# Patient Record
Sex: Female | Born: 1935 | Race: White | Hispanic: No | Smoking: Former smoker
Health system: Southern US, Community
[De-identification: ages and names within clinical notes are randomized; demographics above are authoritative.]

## PROBLEM LIST (undated history)

## (undated) DIAGNOSIS — I4891 Unspecified atrial fibrillation: Secondary | ICD-10-CM

## (undated) DIAGNOSIS — J189 Pneumonia, unspecified organism: Secondary | ICD-10-CM

## (undated) DIAGNOSIS — F0390 Unspecified dementia without behavioral disturbance: Secondary | ICD-10-CM

## (undated) DIAGNOSIS — E876 Hypokalemia: Secondary | ICD-10-CM

## (undated) DIAGNOSIS — S2239XA Fracture of one rib, unspecified side, initial encounter for closed fracture: Secondary | ICD-10-CM

## (undated) DIAGNOSIS — I2510 Atherosclerotic heart disease of native coronary artery without angina pectoris: Secondary | ICD-10-CM

## (undated) DIAGNOSIS — J449 Chronic obstructive pulmonary disease, unspecified: Secondary | ICD-10-CM

## (undated) DIAGNOSIS — I219 Acute myocardial infarction, unspecified: Secondary | ICD-10-CM

## (undated) HISTORY — PX: ABDOMINAL HYSTERECTOMY: SHX81

## (undated) HISTORY — PX: APPENDECTOMY: SHX54

---

## 1999-08-09 ENCOUNTER — Encounter: Payer: Self-pay | Admitting: Orthopedic Surgery

## 1999-08-14 ENCOUNTER — Inpatient Hospital Stay (HOSPITAL_COMMUNITY): Admission: RE | Admit: 1999-08-14 | Discharge: 1999-08-18 | Payer: Self-pay | Admitting: Orthopedic Surgery

## 1999-08-14 ENCOUNTER — Encounter: Payer: Self-pay | Admitting: Orthopedic Surgery

## 2000-11-08 ENCOUNTER — Ambulatory Visit (HOSPITAL_COMMUNITY): Admission: RE | Admit: 2000-11-08 | Discharge: 2000-11-08 | Payer: Self-pay | Admitting: Internal Medicine

## 2000-11-08 ENCOUNTER — Encounter: Payer: Self-pay | Admitting: Internal Medicine

## 2001-03-31 ENCOUNTER — Ambulatory Visit (HOSPITAL_COMMUNITY): Admission: RE | Admit: 2001-03-31 | Discharge: 2001-03-31 | Payer: Self-pay | Admitting: Family Medicine

## 2001-03-31 ENCOUNTER — Encounter: Payer: Self-pay | Admitting: Family Medicine

## 2001-09-23 ENCOUNTER — Encounter: Payer: Self-pay | Admitting: Emergency Medicine

## 2001-09-23 ENCOUNTER — Inpatient Hospital Stay (HOSPITAL_COMMUNITY): Admission: EM | Admit: 2001-09-23 | Discharge: 2001-09-27 | Payer: Self-pay | Admitting: Emergency Medicine

## 2001-09-24 ENCOUNTER — Encounter: Payer: Self-pay | Admitting: Family Medicine

## 2001-09-26 ENCOUNTER — Encounter: Payer: Self-pay | Admitting: Family Medicine

## 2003-01-25 ENCOUNTER — Encounter: Payer: Self-pay | Admitting: Emergency Medicine

## 2003-01-25 ENCOUNTER — Inpatient Hospital Stay (HOSPITAL_COMMUNITY): Admission: EM | Admit: 2003-01-25 | Discharge: 2003-02-03 | Payer: Self-pay | Admitting: Emergency Medicine

## 2003-01-31 ENCOUNTER — Encounter: Payer: Self-pay | Admitting: Family Medicine

## 2003-07-12 ENCOUNTER — Inpatient Hospital Stay (HOSPITAL_COMMUNITY): Admission: EM | Admit: 2003-07-12 | Discharge: 2003-07-22 | Payer: Self-pay | Admitting: Emergency Medicine

## 2003-07-16 ENCOUNTER — Encounter: Payer: Self-pay | Admitting: Orthopedic Surgery

## 2003-07-19 ENCOUNTER — Encounter: Payer: Self-pay | Admitting: Orthopedic Surgery

## 2003-07-23 ENCOUNTER — Inpatient Hospital Stay (HOSPITAL_COMMUNITY): Admission: EM | Admit: 2003-07-23 | Discharge: 2003-07-26 | Payer: Self-pay | Admitting: Emergency Medicine

## 2003-12-13 ENCOUNTER — Encounter: Payer: Self-pay | Admitting: Orthopedic Surgery

## 2004-02-15 ENCOUNTER — Ambulatory Visit (HOSPITAL_COMMUNITY): Admission: RE | Admit: 2004-02-15 | Discharge: 2004-02-15 | Payer: Self-pay | Admitting: Ophthalmology

## 2004-02-16 ENCOUNTER — Ambulatory Visit (HOSPITAL_COMMUNITY): Admission: RE | Admit: 2004-02-16 | Discharge: 2004-02-16 | Payer: Self-pay | Admitting: Family Medicine

## 2004-02-16 ENCOUNTER — Ambulatory Visit: Payer: Self-pay | Admitting: Orthopedic Surgery

## 2004-02-18 ENCOUNTER — Ambulatory Visit (HOSPITAL_COMMUNITY): Admission: RE | Admit: 2004-02-18 | Discharge: 2004-02-18 | Payer: Self-pay | Admitting: Orthopedic Surgery

## 2004-02-18 ENCOUNTER — Encounter: Payer: Self-pay | Admitting: Orthopedic Surgery

## 2004-02-18 ENCOUNTER — Ambulatory Visit: Payer: Self-pay | Admitting: Orthopedic Surgery

## 2004-02-28 ENCOUNTER — Ambulatory Visit: Payer: Self-pay | Admitting: Orthopedic Surgery

## 2004-03-07 ENCOUNTER — Encounter: Payer: Self-pay | Admitting: Orthopedic Surgery

## 2004-03-14 ENCOUNTER — Ambulatory Visit (HOSPITAL_COMMUNITY): Admission: RE | Admit: 2004-03-14 | Discharge: 2004-03-14 | Payer: Self-pay | Admitting: Ophthalmology

## 2004-03-25 ENCOUNTER — Emergency Department (HOSPITAL_COMMUNITY): Admission: EM | Admit: 2004-03-25 | Discharge: 2004-03-25 | Payer: Self-pay | Admitting: Emergency Medicine

## 2004-03-31 ENCOUNTER — Ambulatory Visit (HOSPITAL_COMMUNITY): Admission: RE | Admit: 2004-03-31 | Discharge: 2004-04-01 | Payer: Self-pay | Admitting: Orthopedic Surgery

## 2004-08-11 ENCOUNTER — Ambulatory Visit: Payer: Self-pay | Admitting: Cardiology

## 2004-08-11 ENCOUNTER — Ambulatory Visit (HOSPITAL_COMMUNITY): Admission: RE | Admit: 2004-08-11 | Discharge: 2004-08-11 | Payer: Self-pay | Admitting: Family Medicine

## 2005-07-17 ENCOUNTER — Inpatient Hospital Stay (HOSPITAL_COMMUNITY): Admission: AD | Admit: 2005-07-17 | Discharge: 2005-07-28 | Payer: Self-pay | Admitting: Family Medicine

## 2005-07-17 ENCOUNTER — Ambulatory Visit (HOSPITAL_COMMUNITY): Admission: RE | Admit: 2005-07-17 | Discharge: 2005-07-17 | Payer: Self-pay | Admitting: Family Medicine

## 2005-07-23 ENCOUNTER — Ambulatory Visit: Payer: Self-pay | Admitting: Internal Medicine

## 2005-08-16 ENCOUNTER — Inpatient Hospital Stay (HOSPITAL_COMMUNITY): Admission: EM | Admit: 2005-08-16 | Discharge: 2005-08-21 | Payer: Self-pay | Admitting: Emergency Medicine

## 2005-10-04 ENCOUNTER — Inpatient Hospital Stay (HOSPITAL_COMMUNITY): Admission: AD | Admit: 2005-10-04 | Discharge: 2005-10-06 | Payer: Self-pay | Admitting: *Deleted

## 2005-11-19 ENCOUNTER — Ambulatory Visit: Payer: Self-pay | Admitting: Pulmonary Disease

## 2007-05-06 ENCOUNTER — Ambulatory Visit (HOSPITAL_COMMUNITY): Admission: RE | Admit: 2007-05-06 | Discharge: 2007-05-06 | Payer: Self-pay | Admitting: Family Medicine

## 2007-08-11 ENCOUNTER — Ambulatory Visit: Payer: Self-pay | Admitting: Cardiovascular Disease

## 2007-08-11 ENCOUNTER — Inpatient Hospital Stay (HOSPITAL_COMMUNITY): Admission: EM | Admit: 2007-08-11 | Discharge: 2007-08-14 | Payer: Self-pay | Admitting: Emergency Medicine

## 2007-08-13 ENCOUNTER — Encounter (INDEPENDENT_AMBULATORY_CARE_PROVIDER_SITE_OTHER): Payer: Self-pay | Admitting: Family Medicine

## 2007-11-05 ENCOUNTER — Emergency Department (HOSPITAL_COMMUNITY): Admission: EM | Admit: 2007-11-05 | Discharge: 2007-11-05 | Payer: Self-pay | Admitting: Emergency Medicine

## 2008-04-19 ENCOUNTER — Ambulatory Visit (HOSPITAL_COMMUNITY): Admission: RE | Admit: 2008-04-19 | Discharge: 2008-04-19 | Payer: Self-pay | Admitting: Family Medicine

## 2008-05-21 ENCOUNTER — Ambulatory Visit: Payer: Self-pay | Admitting: Vascular Surgery

## 2009-06-02 ENCOUNTER — Inpatient Hospital Stay (HOSPITAL_COMMUNITY): Admission: EM | Admit: 2009-06-02 | Discharge: 2009-06-14 | Payer: Self-pay | Admitting: Emergency Medicine

## 2009-06-06 ENCOUNTER — Encounter (INDEPENDENT_AMBULATORY_CARE_PROVIDER_SITE_OTHER): Payer: Self-pay | Admitting: Internal Medicine

## 2009-06-14 ENCOUNTER — Inpatient Hospital Stay: Admission: AD | Admit: 2009-06-14 | Discharge: 2009-06-25 | Payer: Self-pay | Admitting: Internal Medicine

## 2009-06-22 ENCOUNTER — Ambulatory Visit (HOSPITAL_COMMUNITY): Admission: RE | Admit: 2009-06-22 | Discharge: 2009-06-22 | Payer: Self-pay | Admitting: Internal Medicine

## 2009-06-24 ENCOUNTER — Ambulatory Visit (HOSPITAL_COMMUNITY): Admission: RE | Admit: 2009-06-24 | Discharge: 2009-06-24 | Payer: Self-pay | Admitting: Internal Medicine

## 2009-07-30 ENCOUNTER — Inpatient Hospital Stay (HOSPITAL_COMMUNITY): Admission: EM | Admit: 2009-07-30 | Discharge: 2009-08-05 | Payer: Self-pay | Admitting: Emergency Medicine

## 2009-08-05 ENCOUNTER — Inpatient Hospital Stay: Admission: AD | Admit: 2009-08-05 | Discharge: 2009-08-07 | Payer: Self-pay | Admitting: Internal Medicine

## 2009-08-07 ENCOUNTER — Inpatient Hospital Stay (HOSPITAL_COMMUNITY): Admission: EM | Admit: 2009-08-07 | Discharge: 2009-08-10 | Payer: Self-pay | Admitting: Emergency Medicine

## 2009-08-10 ENCOUNTER — Inpatient Hospital Stay: Admission: AD | Admit: 2009-08-10 | Discharge: 2009-10-09 | Payer: Self-pay | Admitting: Internal Medicine

## 2009-08-18 ENCOUNTER — Ambulatory Visit (HOSPITAL_COMMUNITY): Admission: RE | Admit: 2009-08-18 | Discharge: 2009-08-18 | Payer: Self-pay | Admitting: Internal Medicine

## 2009-08-24 ENCOUNTER — Ambulatory Visit (HOSPITAL_COMMUNITY): Admission: RE | Admit: 2009-08-24 | Discharge: 2009-08-24 | Payer: Self-pay | Admitting: Internal Medicine

## 2009-09-15 ENCOUNTER — Ambulatory Visit (HOSPITAL_COMMUNITY): Admission: RE | Admit: 2009-09-15 | Discharge: 2009-09-15 | Payer: Self-pay | Admitting: Internal Medicine

## 2009-10-09 ENCOUNTER — Inpatient Hospital Stay (HOSPITAL_COMMUNITY): Admission: EM | Admit: 2009-10-09 | Discharge: 2009-10-14 | Payer: Self-pay | Admitting: Emergency Medicine

## 2009-10-09 ENCOUNTER — Ambulatory Visit (HOSPITAL_COMMUNITY): Admission: RE | Admit: 2009-10-09 | Discharge: 2009-10-09 | Payer: Self-pay | Admitting: Internal Medicine

## 2009-10-14 ENCOUNTER — Inpatient Hospital Stay: Admission: AD | Admit: 2009-10-14 | Discharge: 2010-03-04 | Payer: Self-pay | Admitting: Internal Medicine

## 2009-12-08 ENCOUNTER — Ambulatory Visit (HOSPITAL_COMMUNITY): Admission: RE | Admit: 2009-12-08 | Discharge: 2009-12-08 | Payer: Self-pay | Admitting: Internal Medicine

## 2009-12-12 ENCOUNTER — Ambulatory Visit (HOSPITAL_COMMUNITY): Admission: RE | Admit: 2009-12-12 | Discharge: 2009-12-12 | Payer: Self-pay | Admitting: Internal Medicine

## 2009-12-12 ENCOUNTER — Emergency Department (HOSPITAL_COMMUNITY)
Admission: EM | Admit: 2009-12-12 | Discharge: 2009-12-12 | Payer: Self-pay | Source: Home / Self Care | Admitting: Emergency Medicine

## 2009-12-13 ENCOUNTER — Encounter: Payer: Self-pay | Admitting: Orthopedic Surgery

## 2009-12-13 ENCOUNTER — Telehealth: Payer: Self-pay | Admitting: Orthopedic Surgery

## 2009-12-23 ENCOUNTER — Telehealth: Payer: Self-pay | Admitting: Orthopedic Surgery

## 2010-02-16 ENCOUNTER — Ambulatory Visit (HOSPITAL_COMMUNITY): Admission: RE | Admit: 2010-02-16 | Discharge: 2010-02-16 | Payer: Self-pay | Admitting: Internal Medicine

## 2010-02-23 ENCOUNTER — Ambulatory Visit (HOSPITAL_COMMUNITY): Admission: RE | Admit: 2010-02-23 | Discharge: 2010-02-23 | Payer: Self-pay | Admitting: Internal Medicine

## 2010-03-04 ENCOUNTER — Inpatient Hospital Stay (HOSPITAL_COMMUNITY)
Admission: EM | Admit: 2010-03-04 | Discharge: 2010-03-10 | Payer: Self-pay | Source: Home / Self Care | Admitting: Emergency Medicine

## 2010-03-10 ENCOUNTER — Inpatient Hospital Stay
Admission: AD | Admit: 2010-03-10 | Discharge: 2010-12-30 | Disposition: A | Discharge status: Nursing Home (Includes ICF) | Payer: Self-pay | Source: Home / Self Care | Attending: Internal Medicine | Admitting: Internal Medicine

## 2010-03-10 DIAGNOSIS — R52 Pain, unspecified: Principal | ICD-10-CM

## 2010-03-17 ENCOUNTER — Ambulatory Visit (HOSPITAL_COMMUNITY)
Admission: RE | Admit: 2010-03-17 | Discharge: 2010-03-17 | Payer: Self-pay | Source: Home / Self Care | Attending: Internal Medicine | Admitting: Internal Medicine

## 2010-05-11 NOTE — Letter (Signed)
Summary: hosp progress note LT hip  hosp progress note LT hip   Imported By: Cammie Sickle 12/13/2009 12:09:48  _____________________________________________________________________  External Attachment:    Type:   Image     Comment:   External Document

## 2010-05-11 NOTE — Letter (Signed)
Summary: Physician order [facility] RT hip  Physician order [facility] RT hip   Imported By: Cammie Sickle 12/13/2009 19:05:47  _____________________________________________________________________  External Attachment:    Type:   Image     Comment:   External Document

## 2010-05-11 NOTE — Progress Notes (Signed)
Summary: [facility] cx'd office appt,still having Xray as ord'd  Phone Note Other Science writer: Nursing facility Summary of Call: Mavis from [facility] [phone]) called to cancel patient's appointment for 12/28/09, which was her hosp fol/up,re-check hip, s/p hip fx, per request from daughter, who is unable to be with patient for the appt, and daughter's concerns about transporting patient.  Xray will be done as ordered through [facility].  Dr Romeo Apple to advise after viewing Xray film and report. Initial call taken by: Cammie Sickle,  December 23, 2009 12:09 PM

## 2010-05-11 NOTE — Progress Notes (Signed)
Summary: call from [facility] s/p patient fall RT hip  Phone Note Other Incoming   Caller: [facility] Summary of Call: Mavis from [facility] - ph [phone] - called to request appointment for resident [facility].  States patient taken to [facility] ER from their facility, s/p fall, and had Xray on 12/12/09, showing RT hip fracture.  States patient is to follow up w/ Dr Romeo Apple, per ER physician.  ER report and Xray report have been scanned in.  Please advise. Initial call taken by: Cammie Sickle,  December 13, 2009 12:16 PM  Follow-up for Phone Call        yes 2 weekfollow up   xrays of the injured hip   if mobile do it here if not do it at the hospital in 2 weeks  then f/u here  Follow-up by: Fuller Canada MD,  December 13, 2009 2:17 PM  Additional Follow-up for Phone Call Additional follow up Details #1::        Patient getting 2 week appt scheduled per [facility]  w/Xrays at [facility] prior. [facility] requests orders.  They currently have her in bed.  Please advise. Additional Follow-up by: Cammie Sickle,  December 13, 2009 2:56 PM

## 2010-05-11 NOTE — Op Note (Signed)
Summary: Op Rpt LT distal radius OTIF  Op Rpt LT distal radius OTIF   Imported By: Cammie Sickle 12/13/2009 12:06:30  _____________________________________________________________________  External Attachment:    Type:   Image     Comment:   External Document

## 2010-05-11 NOTE — Miscellaneous (Signed)
  orders   bed to chair x 48 hrs   then wbat   f/u xrays in 2 weeks   and appt here  Clinical Lists Changes

## 2010-05-11 NOTE — Progress Notes (Signed)
Summary: Office Visit note 03/07/04 LT wrist  Office Visitnote 03/07/04 LT wrist   Imported By: Cammie Sickle 12/13/2009 12:10:32  _____________________________________________________________________  External Attachment:    Type:   Image     Comment:   External Document

## 2010-06-11 ENCOUNTER — Ambulatory Visit (HOSPITAL_COMMUNITY): Payer: Medicare Other | Attending: Internal Medicine

## 2010-06-11 ENCOUNTER — Ambulatory Visit (HOSPITAL_COMMUNITY)
Admission: RE | Admit: 2010-06-11 | Discharge: 2010-06-11 | Disposition: A | Discharge status: Home / Self Care | Payer: Medicare Other | Source: Ambulatory Visit | Attending: Internal Medicine | Admitting: Internal Medicine

## 2010-06-11 DIAGNOSIS — R062 Wheezing: Secondary | ICD-10-CM | POA: Insufficient documentation

## 2010-06-11 DIAGNOSIS — R0789 Other chest pain: Secondary | ICD-10-CM | POA: Insufficient documentation

## 2010-06-19 LAB — HEPATIC FUNCTION PANEL
ALT: 20 U/L (ref 0–35)
AST: 28 U/L (ref 0–37)
Albumin: 2.8 g/dL — ABNORMAL LOW (ref 3.5–5.2)
Alkaline Phosphatase: 86 U/L (ref 39–117)
Total Protein: 6 g/dL (ref 6.0–8.3)

## 2010-06-19 LAB — DIFFERENTIAL
Basophils Relative: 0 % (ref 0–1)
Lymphocytes Relative: 17 % (ref 12–46)
Lymphs Abs: 1.4 10*3/uL (ref 0.7–4.0)
Monocytes Absolute: 0.9 10*3/uL (ref 0.1–1.0)
Monocytes Relative: 10 % (ref 3–12)
Neutro Abs: 5.7 10*3/uL (ref 1.7–7.7)
Neutrophils Relative %: 69 % (ref 43–77)

## 2010-06-19 LAB — BASIC METABOLIC PANEL
BUN: 6 mg/dL (ref 6–23)
Calcium: 8.7 mg/dL (ref 8.4–10.5)
Chloride: 98 mEq/L (ref 96–112)
Creatinine, Ser: 0.96 mg/dL (ref 0.4–1.2)

## 2010-06-19 LAB — CBC
MCH: 31 pg (ref 26.0–34.0)
Platelets: 311 10*3/uL (ref 150–400)
RBC: 3.26 MIL/uL — ABNORMAL LOW (ref 3.87–5.11)
WBC: 8.2 10*3/uL (ref 4.0–10.5)

## 2010-06-20 LAB — CBC
HCT: 29.3 % — ABNORMAL LOW (ref 36.0–46.0)
HCT: 30.3 % — ABNORMAL LOW (ref 36.0–46.0)
Hemoglobin: 10 g/dL — ABNORMAL LOW (ref 12.0–15.0)
MCH: 30.4 pg (ref 26.0–34.0)
MCH: 30.6 pg (ref 26.0–34.0)
MCH: 31.3 pg (ref 26.0–34.0)
MCH: 31.3 pg (ref 26.0–34.0)
MCHC: 31.5 g/dL (ref 30.0–36.0)
MCHC: 32.4 g/dL (ref 30.0–36.0)
MCV: 95 fL (ref 78.0–100.0)
MCV: 96.4 fL (ref 78.0–100.0)
Platelets: 226 10*3/uL (ref 150–400)
Platelets: 269 10*3/uL (ref 150–400)
Platelets: 283 10*3/uL (ref 150–400)
RBC: 3.04 MIL/uL — ABNORMAL LOW (ref 3.87–5.11)
RBC: 3.19 MIL/uL — ABNORMAL LOW (ref 3.87–5.11)
RDW: 13.8 % (ref 11.5–15.5)
WBC: 10.2 10*3/uL (ref 4.0–10.5)
WBC: 8.3 10*3/uL (ref 4.0–10.5)
WBC: 8.5 10*3/uL (ref 4.0–10.5)

## 2010-06-20 LAB — DIFFERENTIAL
Basophils Absolute: 0 10*3/uL (ref 0.0–0.1)
Basophils Absolute: 0 10*3/uL (ref 0.0–0.1)
Basophils Relative: 0 % (ref 0–1)
Eosinophils Absolute: 0.1 10*3/uL (ref 0.0–0.7)
Eosinophils Absolute: 0.2 10*3/uL (ref 0.0–0.7)
Eosinophils Absolute: 0.2 10*3/uL (ref 0.0–0.7)
Eosinophils Relative: 1 % (ref 0–5)
Eosinophils Relative: 1 % (ref 0–5)
Lymphocytes Relative: 13 % (ref 12–46)
Lymphocytes Relative: 21 % (ref 12–46)
Lymphs Abs: 1.4 10*3/uL (ref 0.7–4.0)
Lymphs Abs: 1.4 10*3/uL (ref 0.7–4.0)
Monocytes Absolute: 1.3 10*3/uL — ABNORMAL HIGH (ref 0.1–1.0)
Monocytes Absolute: 1.4 10*3/uL — ABNORMAL HIGH (ref 0.1–1.0)
Monocytes Relative: 14 % — ABNORMAL HIGH (ref 3–12)
Neutro Abs: 5.5 10*3/uL (ref 1.7–7.7)
Neutro Abs: 7.1 10*3/uL (ref 1.7–7.7)
Neutrophils Relative %: 67 % (ref 43–77)
Neutrophils Relative %: 67 % (ref 43–77)

## 2010-06-20 LAB — POCT CARDIAC MARKERS
CKMB, poc: <1 ng/mL — ABNORMAL LOW (ref 1.0–8.0)
Troponin i, poc: <0.05 ng/mL (ref 0.00–0.09)

## 2010-06-20 LAB — BASIC METABOLIC PANEL
CO2: 31 mEq/L (ref 19–32)
CO2: 32 mEq/L (ref 19–32)
Calcium: 8.5 mg/dL (ref 8.4–10.5)
Calcium: 8.7 mg/dL (ref 8.4–10.5)
Calcium: 9.4 mg/dL (ref 8.4–10.5)
Creatinine, Ser: 0.93 mg/dL (ref 0.4–1.2)
Creatinine, Ser: 0.93 mg/dL (ref 0.4–1.2)
GFR calc Af Amer: >60 mL/min (ref >60)
GFR calc Af Amer: >60 mL/min (ref >60)
GFR calc Af Amer: >60 mL/min (ref >60)
GFR calc non Af Amer: 51 mL/min — ABNORMAL LOW (ref >60)
GFR calc non Af Amer: 58 mL/min — ABNORMAL LOW (ref >60)
GFR calc non Af Amer: 58 mL/min — ABNORMAL LOW (ref >60)
Glucose, Bld: 87 mg/dL (ref 70–99)
Sodium: 140 mEq/L (ref 135–145)

## 2010-06-20 LAB — URINE CULTURE
Colony Count: >=100000
Culture  Setup Time: 201111272046

## 2010-06-20 LAB — CARDIAC PANEL(CRET KIN+CKTOT+MB+TROPI)
Relative Index: INVALID (ref 0.0–2.5)
Total CK: 74 U/L (ref 7–177)
Total CK: 80 U/L (ref 7–177)
Troponin I: 0.02 ng/mL (ref 0.00–0.06)

## 2010-06-20 LAB — CULTURE, BLOOD (ROUTINE X 2): Culture: NO GROWTH

## 2010-06-20 LAB — TSH: TSH: 2.28 u[IU]/mL (ref 0.350–4.500)

## 2010-06-20 LAB — HEPATIC FUNCTION PANEL
AST: 38 U/L — ABNORMAL HIGH (ref 0–37)
Albumin: 3.3 g/dL — ABNORMAL LOW (ref 3.5–5.2)
Alkaline Phosphatase: 89 U/L (ref 39–117)
Total Bilirubin: 0.5 mg/dL (ref 0.3–1.2)
Total Protein: 7.1 g/dL (ref 6.0–8.3)

## 2010-06-20 LAB — POCT I-STAT, CHEM 8
BUN: 14 mg/dL (ref 6–23)
Chloride: 94 mEq/L — ABNORMAL LOW (ref 96–112)
Creatinine, Ser: 1 mg/dL (ref 0.4–1.2)
Glucose, Bld: 103 mg/dL — ABNORMAL HIGH (ref 70–99)
HCT: 33 % — ABNORMAL LOW (ref 36.0–46.0)
Potassium: 3.4 mEq/L — ABNORMAL LOW (ref 3.5–5.1)

## 2010-06-20 LAB — URINE MICROSCOPIC-ADD ON

## 2010-06-20 LAB — VANCOMYCIN, RANDOM: Vancomycin Rm: 12.8 ug/mL

## 2010-06-20 LAB — AMYLASE: Amylase: 29 U/L (ref 0–105)

## 2010-06-20 LAB — URINALYSIS, ROUTINE W REFLEX MICROSCOPIC
Bilirubin Urine: NEGATIVE
Nitrite: NEGATIVE
Specific Gravity, Urine: 1.01 (ref 1.005–1.030)
Urobilinogen, UA: 0.2 mg/dL (ref 0.0–1.0)

## 2010-06-20 LAB — LIPASE, BLOOD: Lipase: 15 U/L (ref 11–59)

## 2010-06-20 LAB — PROTIME-INR: Prothrombin Time: 13.4 seconds (ref 11.6–15.2)

## 2010-06-20 LAB — MAGNESIUM: Magnesium: 2.6 mg/dL — ABNORMAL HIGH (ref 1.5–2.5)

## 2010-06-25 LAB — COMPREHENSIVE METABOLIC PANEL
ALT: 21 U/L (ref 0–35)
Albumin: 3 g/dL — ABNORMAL LOW (ref 3.5–5.2)
Albumin: 3.9 g/dL (ref 3.5–5.2)
Alkaline Phosphatase: 89 U/L (ref 39–117)
BUN: 12 mg/dL (ref 6–23)
Creatinine, Ser: 0.65 mg/dL (ref 0.4–1.2)
GFR calc Af Amer: >60 mL/min (ref >60)
Glucose, Bld: 110 mg/dL — ABNORMAL HIGH (ref 70–99)
Glucose, Bld: 72 mg/dL (ref 70–99)
Potassium: 3.9 mEq/L (ref 3.5–5.1)
Potassium: 4.1 mEq/L (ref 3.5–5.1)
Sodium: 138 mEq/L (ref 135–145)
Total Protein: 5.6 g/dL — ABNORMAL LOW (ref 6.0–8.3)
Total Protein: 7.2 g/dL (ref 6.0–8.3)

## 2010-06-25 LAB — BLOOD GAS, ARTERIAL
Acid-Base Excess: 13.8 mmol/L — ABNORMAL HIGH (ref 0.0–2.0)
Acid-Base Excess: 15.4 mmol/L — ABNORMAL HIGH (ref 0.0–2.0)
Bicarbonate: 37.8 mEq/L — ABNORMAL HIGH (ref 20.0–24.0)
O2 Content: 3 L/min
O2 Content: 4 L/min
O2 Saturation: 94.7 %
Patient temperature: 37
Patient temperature: 37
TCO2: 35.8 mmol/L (ref 0–100)
pCO2 arterial: 57.3 mmHg (ref 35.0–45.0)
pCO2 arterial: 81.3 mmHg (ref 35.0–45.0)
pH, Arterial: 7.289 — ABNORMAL LOW (ref 7.350–7.400)

## 2010-06-25 LAB — DIFFERENTIAL
Basophils Absolute: 0 10*3/uL (ref 0.0–0.1)
Basophils Absolute: 0 10*3/uL (ref 0.0–0.1)
Basophils Absolute: 0 10*3/uL (ref 0.0–0.1)
Basophils Absolute: 0 10*3/uL (ref 0.0–0.1)
Basophils Relative: 0 % (ref 0–1)
Basophils Relative: 0 % (ref 0–1)
Basophils Relative: 0 % (ref 0–1)
Basophils Relative: 0 % (ref 0–1)
Basophils Relative: 0 % (ref 0–1)
Eosinophils Absolute: 0 10*3/uL (ref 0.0–0.7)
Eosinophils Absolute: 0 10*3/uL (ref 0.0–0.7)
Eosinophils Absolute: 0 10*3/uL (ref 0.0–0.7)
Eosinophils Relative: 0 % (ref 0–5)
Eosinophils Relative: 0 % (ref 0–5)
Eosinophils Relative: 0 % (ref 0–5)
Eosinophils Relative: 0 % (ref 0–5)
Eosinophils Relative: 0 % (ref 0–5)
Lymphocytes Relative: 3 % — ABNORMAL LOW (ref 12–46)
Lymphs Abs: 0.4 10*3/uL — ABNORMAL LOW (ref 0.7–4.0)
Monocytes Absolute: 0.3 10*3/uL (ref 0.1–1.0)
Monocytes Absolute: 0.5 10*3/uL (ref 0.1–1.0)
Monocytes Absolute: 0.5 10*3/uL (ref 0.1–1.0)
Monocytes Absolute: 0.6 10*3/uL (ref 0.1–1.0)
Monocytes Relative: 3 % (ref 3–12)
Monocytes Relative: 5 % (ref 3–12)
Neutro Abs: 6.8 10*3/uL (ref 1.7–7.7)
Neutro Abs: 6.9 10*3/uL (ref 1.7–7.7)
Neutrophils Relative %: 80 % — ABNORMAL HIGH (ref 43–77)
Neutrophils Relative %: 85 % — ABNORMAL HIGH (ref 43–77)

## 2010-06-25 LAB — BASIC METABOLIC PANEL
BUN: 14 mg/dL (ref 6–23)
CO2: 40 mEq/L — ABNORMAL HIGH (ref 19–32)
Calcium: 8.9 mg/dL (ref 8.4–10.5)
Creatinine, Ser: 0.78 mg/dL (ref 0.4–1.2)
GFR calc Af Amer: >60 mL/min (ref >60)
Glucose, Bld: 89 mg/dL (ref 70–99)

## 2010-06-25 LAB — CBC
HCT: 28.1 % — ABNORMAL LOW (ref 36.0–46.0)
HCT: 28.6 % — ABNORMAL LOW (ref 36.0–46.0)
HCT: 33.9 % — ABNORMAL LOW (ref 36.0–46.0)
HCT: 35.6 % — ABNORMAL LOW (ref 36.0–46.0)
MCH: 28.9 pg (ref 26.0–34.0)
MCH: 29.3 pg (ref 26.0–34.0)
MCHC: 31.6 g/dL (ref 30.0–36.0)
MCHC: 31.7 g/dL (ref 30.0–36.0)
MCHC: 31.9 g/dL (ref 30.0–36.0)
MCHC: 32.3 g/dL (ref 30.0–36.0)
MCV: 90.5 fL (ref 78.0–100.0)
MCV: 91.2 fL (ref 78.0–100.0)
MCV: 91.5 fL (ref 78.0–100.0)
Platelets: 194 10*3/uL (ref 150–400)
Platelets: 240 10*3/uL (ref 150–400)
Platelets: 248 10*3/uL (ref 150–400)
RBC: 3.44 MIL/uL — ABNORMAL LOW (ref 3.87–5.11)
RBC: 3.75 MIL/uL — ABNORMAL LOW (ref 3.87–5.11)
RDW: 15.9 % — ABNORMAL HIGH (ref 11.5–15.5)
RDW: 16 % — ABNORMAL HIGH (ref 11.5–15.5)
RDW: 16.1 % — ABNORMAL HIGH (ref 11.5–15.5)
RDW: 16.2 % — ABNORMAL HIGH (ref 11.5–15.5)
RDW: 16.5 % — ABNORMAL HIGH (ref 11.5–15.5)
WBC: 16.1 10*3/uL — ABNORMAL HIGH (ref 4.0–10.5)
WBC: 17.3 10*3/uL — ABNORMAL HIGH (ref 4.0–10.5)

## 2010-06-25 LAB — URINALYSIS, ROUTINE W REFLEX MICROSCOPIC
Glucose, UA: NEGATIVE mg/dL
Ketones, ur: NEGATIVE mg/dL
pH: 6.5 (ref 5.0–8.0)

## 2010-06-25 LAB — CARDIAC PANEL(CRET KIN+CKTOT+MB+TROPI)
CK, MB: 3.7 ng/mL (ref 0.3–4.0)
CK, MB: 4.1 ng/mL — ABNORMAL HIGH (ref 0.3–4.0)
Relative Index: INVALID (ref 0.0–2.5)
Relative Index: INVALID (ref 0.0–2.5)
Total CK: 33 U/L (ref 7–177)
Total CK: 36 U/L (ref 7–177)
Troponin I: 0.03 ng/mL (ref 0.00–0.06)
Troponin I: 0.04 ng/mL (ref 0.00–0.06)
Troponin I: 0.05 ng/mL (ref 0.00–0.06)

## 2010-06-25 LAB — BRAIN NATRIURETIC PEPTIDE: Pro B Natriuretic peptide (BNP): 369 pg/mL — ABNORMAL HIGH (ref 0.0–100.0)

## 2010-06-25 LAB — MRSA PCR SCREENING: MRSA by PCR: NEGATIVE

## 2010-06-25 LAB — CULTURE, BLOOD (ROUTINE X 2)
Report Status: 7082011
Report Status: 7082011

## 2010-06-25 LAB — URINE CULTURE
Colony Count: 35000
Special Requests: POSITIVE

## 2010-06-25 LAB — URINE MICROSCOPIC-ADD ON

## 2010-06-27 LAB — POCT CARDIAC MARKERS
CKMB, poc: <1 ng/mL — ABNORMAL LOW (ref 1.0–8.0)
Myoglobin, poc: 66.3 ng/mL (ref 12–200)
Troponin i, poc: <0.05 ng/mL (ref 0.00–0.09)

## 2010-06-27 LAB — TSH: TSH: 2.825 u[IU]/mL (ref 0.350–4.500)

## 2010-06-27 LAB — IRON AND TIBC
Iron: 20 ug/dL — ABNORMAL LOW (ref 42–135)
TIBC: 262 ug/dL (ref 250–470)

## 2010-06-27 LAB — COMPREHENSIVE METABOLIC PANEL
ALT: 14 U/L (ref 0–35)
AST: 20 U/L (ref 0–37)
AST: 23 U/L (ref 0–37)
Albumin: 3.3 g/dL — ABNORMAL LOW (ref 3.5–5.2)
Albumin: 3.4 g/dL — ABNORMAL LOW (ref 3.5–5.2)
Alkaline Phosphatase: 75 U/L (ref 39–117)
BUN: 7 mg/dL (ref 6–23)
BUN: 11 mg/dL (ref 6–23)
BUN: 7 mg/dL (ref 6–23)
CO2: 40 mEq/L — ABNORMAL HIGH (ref 19–32)
Calcium: 7.7 mg/dL — ABNORMAL LOW (ref 8.4–10.5)
Calcium: 8.2 mg/dL — ABNORMAL LOW (ref 8.4–10.5)
Chloride: 88 mEq/L — ABNORMAL LOW (ref 96–112)
Chloride: 93 mEq/L — ABNORMAL LOW (ref 96–112)
Creatinine, Ser: 0.54 mg/dL (ref 0.4–1.2)
Creatinine, Ser: 0.58 mg/dL (ref 0.4–1.2)
GFR calc Af Amer: >60 mL/min (ref >60)
GFR calc Af Amer: >60 mL/min (ref >60)
GFR calc non Af Amer: >60 mL/min (ref >60)
Glucose, Bld: 98 mg/dL (ref 70–99)
Potassium: 3 mEq/L — ABNORMAL LOW (ref 3.5–5.1)
Sodium: 140 mEq/L (ref 135–145)
Total Bilirubin: 0.3 mg/dL (ref 0.3–1.2)
Total Protein: 5.1 g/dL — ABNORMAL LOW (ref 6.0–8.3)
Total Protein: 6.4 g/dL (ref 6.0–8.3)

## 2010-06-27 LAB — CULTURE, BLOOD (ROUTINE X 2)
Culture: NO GROWTH
Culture: NO GROWTH
Report Status: 5062011
Report Status: 5062011
Report Status: 4282011

## 2010-06-27 LAB — T4, FREE: Free T4: 0.98 ng/dL (ref 0.80–1.80)

## 2010-06-27 LAB — BLOOD GAS, ARTERIAL
Acid-Base Excess: 13.6 mmol/L — ABNORMAL HIGH (ref 0.0–2.0)
Acid-Base Excess: 13.4 mmol/L — ABNORMAL HIGH (ref 0.0–2.0)
Acid-Base Excess: 14.6 mmol/L — ABNORMAL HIGH (ref 0.0–2.0)
Acid-Base Excess: 15.1 mmol/L — ABNORMAL HIGH (ref 0.0–2.0)
Acid-Base Excess: 16.7 mmol/L — ABNORMAL HIGH (ref 0.0–2.0)
Acid-Base Excess: 19.5 mmol/L — ABNORMAL HIGH (ref 0.0–2.0)
Bicarbonate: 39.5 mEq/L — ABNORMAL HIGH (ref 20.0–24.0)
Bicarbonate: 39.3 mEq/L — ABNORMAL HIGH (ref 20.0–24.0)
Bicarbonate: 47.3 mEq/L — ABNORMAL HIGH (ref 20.0–24.0)
Delivery systems: POSITIVE
Delivery systems: POSITIVE
Expiratory PAP: 8
FIO2: 35 %
Inspiratory PAP: 18
Inspiratory PAP: 20
Inspiratory PAP: 20
O2 Content: 35 L/min
O2 Saturation: 94.4 %
O2 Saturation: 89.5 %
O2 Saturation: 99.7 %
Patient temperature: 37
Patient temperature: 37
Patient temperature: 37
TCO2: 37.1 mmol/L (ref 0–100)
TCO2: 37 mmol/L (ref 0–100)
TCO2: 37.6 mmol/L (ref 0–100)
TCO2: 37.8 mmol/L (ref 0–100)
TCO2: 45.5 mmol/L (ref 0–100)
pCO2 arterial: 115 mmHg (ref 35.0–45.0)
pCO2 arterial: 62.3 mmHg (ref 35.0–45.0)
pCO2 arterial: 99.5 mmHg (ref 35.0–45.0)
pH, Arterial: 7.215 — ABNORMAL LOW (ref 7.350–7.400)
pH, Arterial: 7.371 (ref 7.350–7.400)
pH, Arterial: 7.419 — ABNORMAL HIGH (ref 7.350–7.400)
pH, Arterial: 7.426 — ABNORMAL HIGH (ref 7.350–7.400)
pO2, Arterial: 336 mmHg — ABNORMAL HIGH (ref 80.0–100.0)
pO2, Arterial: 52.8 mmHg — ABNORMAL LOW (ref 80.0–100.0)
pO2, Arterial: 71.9 mmHg — ABNORMAL LOW (ref 80.0–100.0)

## 2010-06-27 LAB — CBC
HCT: 29.8 % — ABNORMAL LOW (ref 36.0–46.0)
HCT: 25.6 % — ABNORMAL LOW (ref 36.0–46.0)
HCT: 32.5 % — ABNORMAL LOW (ref 36.0–46.0)
Hemoglobin: 9.7 g/dL — ABNORMAL LOW (ref 12.0–15.0)
MCHC: 32.9 g/dL (ref 30.0–36.0)
MCHC: 32.8 g/dL (ref 30.0–36.0)
MCHC: 33 g/dL (ref 30.0–36.0)
MCHC: 33 g/dL (ref 30.0–36.0)
MCV: 90.6 fL (ref 78.0–100.0)
MCV: 90.8 fL (ref 78.0–100.0)
MCV: 90.9 fL (ref 78.0–100.0)
MCV: 91.3 fL (ref 78.0–100.0)
Platelets: 318 10*3/uL (ref 150–400)
Platelets: 198 10*3/uL (ref 150–400)
Platelets: 223 10*3/uL (ref 150–400)
Platelets: 224 10*3/uL (ref 150–400)
Platelets: 245 10*3/uL (ref 150–400)
RBC: 3.26 MIL/uL — ABNORMAL LOW (ref 3.87–5.11)
RBC: 3.28 MIL/uL — ABNORMAL LOW (ref 3.87–5.11)
RDW: 14.9 % (ref 11.5–15.5)
RDW: 14 % (ref 11.5–15.5)
RDW: 14.1 % (ref 11.5–15.5)
RDW: 14.5 % (ref 11.5–15.5)
RDW: 14.6 % (ref 11.5–15.5)
RDW: 14.6 % (ref 11.5–15.5)
WBC: 12.4 10*3/uL — ABNORMAL HIGH (ref 4.0–10.5)
WBC: 5.2 10*3/uL (ref 4.0–10.5)
WBC: 8.2 10*3/uL (ref 4.0–10.5)

## 2010-06-27 LAB — URINALYSIS, ROUTINE W REFLEX MICROSCOPIC
Glucose, UA: NEGATIVE mg/dL
Leukocytes, UA: NEGATIVE
Protein, ur: 30 mg/dL — AB
Specific Gravity, Urine: 1.02 (ref 1.005–1.030)
pH: 6.5 (ref 5.0–8.0)

## 2010-06-27 LAB — PROTIME-INR
INR: 1.02 (ref 0.00–1.49)
Prothrombin Time: 13.3 seconds (ref 11.6–15.2)
Prothrombin Time: 13.5 seconds (ref 11.6–15.2)

## 2010-06-27 LAB — DIFFERENTIAL
Basophils Absolute: 0 10*3/uL (ref 0.0–0.1)
Basophils Absolute: 0 10*3/uL (ref 0.0–0.1)
Basophils Absolute: 0 10*3/uL (ref 0.0–0.1)
Basophils Absolute: 0.1 10*3/uL (ref 0.0–0.1)
Basophils Relative: 0 % (ref 0–1)
Basophils Relative: 0 % (ref 0–1)
Eosinophils Absolute: 0.2 10*3/uL (ref 0.0–0.7)
Eosinophils Absolute: 0.4 10*3/uL (ref 0.0–0.7)
Eosinophils Absolute: 0 10*3/uL (ref 0.0–0.7)
Eosinophils Absolute: 0 10*3/uL (ref 0.0–0.7)
Eosinophils Relative: 0 % (ref 0–5)
Eosinophils Relative: 1 % (ref 0–5)
Lymphocytes Relative: 11 % — ABNORMAL LOW (ref 12–46)
Lymphocytes Relative: 7 % — ABNORMAL LOW (ref 12–46)
Lymphocytes Relative: 17 % (ref 12–46)
Lymphocytes Relative: 3 % — ABNORMAL LOW (ref 12–46)
Lymphocytes Relative: 4 % — ABNORMAL LOW (ref 12–46)
Lymphs Abs: 0.9 10*3/uL (ref 0.7–4.0)
Lymphs Abs: 1 10*3/uL (ref 0.7–4.0)
Lymphs Abs: 0.2 10*3/uL — ABNORMAL LOW (ref 0.7–4.0)
Lymphs Abs: 0.6 10*3/uL — ABNORMAL LOW (ref 0.7–4.0)
Monocytes Absolute: 0.7 10*3/uL (ref 0.1–1.0)
Monocytes Absolute: 1.1 10*3/uL — ABNORMAL HIGH (ref 0.1–1.0)
Monocytes Relative: 7 % (ref 3–12)
Monocytes Relative: 8 % (ref 3–12)
Monocytes Relative: 3 % (ref 3–12)
Monocytes Relative: 9 % (ref 3–12)
Neutro Abs: 7.4 10*3/uL (ref 1.7–7.7)
Neutro Abs: 17.2 10*3/uL — ABNORMAL HIGH (ref 1.7–7.7)
Neutro Abs: 6 10*3/uL (ref 1.7–7.7)
Neutro Abs: 8.3 10*3/uL — ABNORMAL HIGH (ref 1.7–7.7)
Neutro Abs: 9.5 10*3/uL — ABNORMAL HIGH (ref 1.7–7.7)
Neutrophils Relative %: 77 % (ref 43–77)
Neutrophils Relative %: 84 % — ABNORMAL HIGH (ref 43–77)
Neutrophils Relative %: 92 % — ABNORMAL HIGH (ref 43–77)
Neutrophils Relative %: 93 % — ABNORMAL HIGH (ref 43–77)
Neutrophils Relative %: 95 % — ABNORMAL HIGH (ref 43–77)

## 2010-06-27 LAB — BASIC METABOLIC PANEL
BUN: 13 mg/dL (ref 6–23)
BUN: 11 mg/dL (ref 6–23)
BUN: 12 mg/dL (ref 6–23)
BUN: 9 mg/dL (ref 6–23)
CO2: 39 mEq/L — ABNORMAL HIGH (ref 19–32)
CO2: 41 mEq/L (ref 19–32)
Calcium: 8.4 mg/dL (ref 8.4–10.5)
Chloride: 97 mEq/L (ref 96–112)
Chloride: 95 mEq/L — ABNORMAL LOW (ref 96–112)
Chloride: 95 mEq/L — ABNORMAL LOW (ref 96–112)
Creatinine, Ser: 0.5 mg/dL (ref 0.4–1.2)
Creatinine, Ser: 0.57 mg/dL (ref 0.4–1.2)
Creatinine, Ser: 0.6 mg/dL (ref 0.4–1.2)
GFR calc Af Amer: >60 mL/min (ref >60)
GFR calc non Af Amer: >60 mL/min (ref >60)
GFR calc non Af Amer: >60 mL/min (ref >60)
Glucose, Bld: 122 mg/dL — ABNORMAL HIGH (ref 70–99)
Potassium: 3.1 mEq/L — ABNORMAL LOW (ref 3.5–5.1)
Potassium: 4.4 mEq/L (ref 3.5–5.1)

## 2010-06-27 LAB — GLUCOSE, CAPILLARY
Glucose-Capillary: 120 mg/dL — ABNORMAL HIGH (ref 70–99)
Glucose-Capillary: 78 mg/dL (ref 70–99)
Glucose-Capillary: 130 mg/dL — ABNORMAL HIGH (ref 70–99)

## 2010-06-27 LAB — CARDIAC PANEL(CRET KIN+CKTOT+MB+TROPI)
CK, MB: 4.1 ng/mL — ABNORMAL HIGH (ref 0.3–4.0)
Relative Index: INVALID (ref 0.0–2.5)
Relative Index: INVALID (ref 0.0–2.5)
Total CK: 84 U/L (ref 7–177)
Troponin I: 0.02 ng/mL (ref 0.00–0.06)

## 2010-06-27 LAB — VITAMIN B12: Vitamin B-12: 347 pg/mL (ref 211–911)

## 2010-06-27 LAB — BRAIN NATRIURETIC PEPTIDE: Pro B Natriuretic peptide (BNP): 441 pg/mL — ABNORMAL HIGH (ref 0.0–100.0)

## 2010-06-27 LAB — LACTIC ACID, PLASMA
Lactic Acid, Venous: 0.6 mmol/L (ref 0.5–2.2)
Lactic Acid, Venous: 1.5 mmol/L (ref 0.5–2.2)

## 2010-06-27 LAB — APTT: aPTT: 28 seconds (ref 24–37)

## 2010-06-27 LAB — CK TOTAL AND CKMB (NOT AT ARMC)
CK, MB: 4.9 ng/mL — ABNORMAL HIGH (ref 0.3–4.0)
Relative Index: INVALID (ref 0.0–2.5)
Relative Index: INVALID (ref 0.0–2.5)
Total CK: 57 U/L (ref 7–177)
Total CK: 58 U/L (ref 7–177)
Total CK: 71 U/L (ref 7–177)

## 2010-06-27 LAB — HEMOGLOBIN AND HEMATOCRIT, BLOOD
HCT: 25.7 % — ABNORMAL LOW (ref 36.0–46.0)
Hemoglobin: 8.4 g/dL — ABNORMAL LOW (ref 12.0–15.0)

## 2010-06-27 LAB — FERRITIN: Ferritin: 108 ng/mL (ref 10–291)

## 2010-06-27 LAB — HEMOCCULT GUIAC POC 1CARD (OFFICE)
Fecal Occult Bld: NEGATIVE
Fecal Occult Bld: NEGATIVE

## 2010-06-27 LAB — URINE MICROSCOPIC-ADD ON

## 2010-06-27 LAB — VANCOMYCIN, TROUGH: Vancomycin Tr: 11.9 ug/mL (ref 10.0–20.0)

## 2010-06-28 LAB — BLOOD GAS, ARTERIAL
Acid-Base Excess: 14.6 mmol/L — ABNORMAL HIGH (ref 0.0–2.0)
Acid-Base Excess: 9.6 mmol/L — ABNORMAL HIGH (ref 0.0–2.0)
Bicarbonate: 33.9 mEq/L — ABNORMAL HIGH (ref 20.0–24.0)
Bicarbonate: 36.5 mEq/L — ABNORMAL HIGH (ref 20.0–24.0)
Bicarbonate: 41.4 mEq/L — ABNORMAL HIGH (ref 20.0–24.0)
Delivery systems: POSITIVE
Expiratory PAP: 8
Inspiratory PAP: 14
O2 Content: 6 L/min
O2 Saturation: 95.8 %
Patient temperature: 37
Patient temperature: 37
Patient temperature: 98.7
TCO2: 32.7 mmol/L (ref 0–100)
TCO2: 38.7 mmol/L (ref 0–100)
pCO2 arterial: 85.7 mmHg (ref 35.0–45.0)
pH, Arterial: 7.345 — ABNORMAL LOW (ref 7.350–7.400)
pH, Arterial: 7.408 — ABNORMAL HIGH (ref 7.350–7.400)
pH, Arterial: 7.465 — ABNORMAL HIGH (ref 7.350–7.400)
pO2, Arterial: 117 mmHg — ABNORMAL HIGH (ref 80.0–100.0)
pO2, Arterial: 64.3 mmHg — ABNORMAL LOW (ref 80.0–100.0)

## 2010-06-28 LAB — DIFFERENTIAL
Basophils Absolute: 0 10*3/uL (ref 0.0–0.1)
Basophils Relative: 0 % (ref 0–1)
Basophils Relative: 0 % (ref 0–1)
Basophils Relative: 0 % (ref 0–1)
Eosinophils Relative: 0 % (ref 0–5)
Lymphocytes Relative: 10 % — ABNORMAL LOW (ref 12–46)
Lymphs Abs: 0.4 10*3/uL — ABNORMAL LOW (ref 0.7–4.0)
Lymphs Abs: 0.8 10*3/uL (ref 0.7–4.0)
Lymphs Abs: 1.1 10*3/uL (ref 0.7–4.0)
Monocytes Absolute: 0.2 10*3/uL (ref 0.1–1.0)
Monocytes Absolute: 0.6 10*3/uL (ref 0.1–1.0)
Monocytes Absolute: 0.9 10*3/uL (ref 0.1–1.0)
Monocytes Relative: 4 % (ref 3–12)
Monocytes Relative: 5 % (ref 3–12)
Monocytes Relative: 9 % (ref 3–12)
Neutro Abs: 5.5 10*3/uL (ref 1.7–7.7)
Neutro Abs: 8 10*3/uL — ABNORMAL HIGH (ref 1.7–7.7)
Neutro Abs: 8.3 10*3/uL — ABNORMAL HIGH (ref 1.7–7.7)
Neutrophils Relative %: 90 % — ABNORMAL HIGH (ref 43–77)
Neutrophils Relative %: 93 % — ABNORMAL HIGH (ref 43–77)

## 2010-06-28 LAB — CARDIAC PANEL(CRET KIN+CKTOT+MB+TROPI)
CK, MB: 1.4 ng/mL (ref 0.3–4.0)
Relative Index: INVALID (ref 0.0–2.5)
Relative Index: INVALID (ref 0.0–2.5)
Total CK: 85 U/L (ref 7–177)
Total CK: 92 U/L (ref 7–177)
Troponin I: 0.02 ng/mL (ref 0.00–0.06)

## 2010-06-28 LAB — BASIC METABOLIC PANEL
CO2: 34 mEq/L — ABNORMAL HIGH (ref 19–32)
CO2: 35 mEq/L — ABNORMAL HIGH (ref 19–32)
Calcium: 8.3 mg/dL — ABNORMAL LOW (ref 8.4–10.5)
Chloride: 96 mEq/L (ref 96–112)
Creatinine, Ser: 0.85 mg/dL (ref 0.4–1.2)
GFR calc Af Amer: >60 mL/min (ref >60)
GFR calc Af Amer: >60 mL/min (ref >60)
GFR calc non Af Amer: 58 mL/min — ABNORMAL LOW (ref >60)
Glucose, Bld: 110 mg/dL — ABNORMAL HIGH (ref 70–99)
Glucose, Bld: 120 mg/dL — ABNORMAL HIGH (ref 70–99)
Potassium: 4.7 mEq/L (ref 3.5–5.1)
Sodium: 142 mEq/L (ref 135–145)

## 2010-06-28 LAB — CBC
HCT: 28.5 % — ABNORMAL LOW (ref 36.0–46.0)
HCT: 33.9 % — ABNORMAL LOW (ref 36.0–46.0)
HCT: 36.6 % (ref 36.0–46.0)
Hemoglobin: 10.4 g/dL — ABNORMAL LOW (ref 12.0–15.0)
Hemoglobin: 9.3 g/dL — ABNORMAL LOW (ref 12.0–15.0)
Hemoglobin: 9.4 g/dL — ABNORMAL LOW (ref 12.0–15.0)
MCHC: 32 g/dL (ref 30.0–36.0)
MCV: 94.5 fL (ref 78.0–100.0)
Platelets: 184 10*3/uL (ref 150–400)
RBC: 3.04 MIL/uL — ABNORMAL LOW (ref 3.87–5.11)
RDW: 14.2 % (ref 11.5–15.5)
RDW: 14.2 % (ref 11.5–15.5)
WBC: 11.8 10*3/uL — ABNORMAL HIGH (ref 4.0–10.5)
WBC: 6.1 10*3/uL (ref 4.0–10.5)

## 2010-06-28 LAB — COMPREHENSIVE METABOLIC PANEL
Albumin: 2.9 g/dL — ABNORMAL LOW (ref 3.5–5.2)
Alkaline Phosphatase: 58 U/L (ref 39–117)
Alkaline Phosphatase: 76 U/L (ref 39–117)
BUN: 11 mg/dL (ref 6–23)
BUN: 17 mg/dL (ref 6–23)
Chloride: 98 mEq/L (ref 96–112)
GFR calc non Af Amer: >60 mL/min (ref >60)
Glucose, Bld: 111 mg/dL — ABNORMAL HIGH (ref 70–99)
Potassium: 2.8 mEq/L — ABNORMAL LOW (ref 3.5–5.1)
Potassium: 4 mEq/L (ref 3.5–5.1)
Sodium: 143 mEq/L (ref 135–145)
Total Bilirubin: 0.5 mg/dL (ref 0.3–1.2)
Total Protein: 5.6 g/dL — ABNORMAL LOW (ref 6.0–8.3)
Total Protein: 6.4 g/dL (ref 6.0–8.3)

## 2010-06-28 LAB — URINALYSIS, ROUTINE W REFLEX MICROSCOPIC
Bilirubin Urine: NEGATIVE
Glucose, UA: NEGATIVE mg/dL
Ketones, ur: NEGATIVE mg/dL
Leukocytes, UA: NEGATIVE
pH: 7 (ref 5.0–8.0)

## 2010-06-28 LAB — HEPATIC FUNCTION PANEL
ALT: 16 U/L (ref 0–35)
AST: 18 U/L (ref 0–37)
Albumin: 2.9 g/dL — ABNORMAL LOW (ref 3.5–5.2)
Bilirubin, Direct: 0.1 mg/dL (ref 0.0–0.3)
Total Bilirubin: 0.3 mg/dL (ref 0.3–1.2)

## 2010-06-28 LAB — URINE CULTURE
Colony Count: NO GROWTH
Culture: NO GROWTH
Special Requests: NEGATIVE

## 2010-06-28 LAB — PROTIME-INR
INR: 0.95 (ref 0.00–1.49)
Prothrombin Time: 12.6 seconds (ref 11.6–15.2)

## 2010-06-28 LAB — URINE MICROSCOPIC-ADD ON

## 2010-06-28 LAB — APTT: aPTT: 33 seconds (ref 24–37)

## 2010-06-28 LAB — PHOSPHORUS: Phosphorus: 3.3 mg/dL (ref 2.3–4.6)

## 2010-06-28 LAB — RPR: RPR Ser Ql: NONREACTIVE

## 2010-06-28 LAB — BRAIN NATRIURETIC PEPTIDE: Pro B Natriuretic peptide (BNP): 340 pg/mL — ABNORMAL HIGH (ref 0.0–100.0)

## 2010-06-28 LAB — LIPID PANEL
Cholesterol: 188 mg/dL (ref 0–200)
Total CHOL/HDL Ratio: 3.6 RATIO

## 2010-07-02 LAB — BLOOD GAS, ARTERIAL
Acid-Base Excess: 6.7 mmol/L — ABNORMAL HIGH (ref 0.0–2.0)
Acid-Base Excess: 7.1 mmol/L — ABNORMAL HIGH (ref 0.0–2.0)
Bicarbonate: 32.2 mEq/L — ABNORMAL HIGH (ref 20.0–24.0)
O2 Content: 3 L/min
TCO2: 29.9 mmol/L (ref 0–100)
TCO2: 30.3 mmol/L (ref 0–100)
pCO2 arterial: 55.6 mmHg — ABNORMAL HIGH (ref 35.0–45.0)
pCO2 arterial: 60.3 mmHg (ref 35.0–45.0)
pH, Arterial: 7.347 — ABNORMAL LOW (ref 7.350–7.400)
pH, Arterial: 7.38 (ref 7.350–7.400)
pO2, Arterial: 62.5 mmHg — ABNORMAL LOW (ref 80.0–100.0)
pO2, Arterial: 80.6 mmHg (ref 80.0–100.0)

## 2010-07-02 LAB — DIFFERENTIAL
Basophils Absolute: 0 10*3/uL (ref 0.0–0.1)
Basophils Absolute: 0 10*3/uL (ref 0.0–0.1)
Basophils Relative: 0 % (ref 0–1)
Basophils Relative: 0 % (ref 0–1)
Eosinophils Absolute: 0 10*3/uL (ref 0.0–0.7)
Eosinophils Relative: 0 % (ref 0–5)
Monocytes Absolute: 0.4 10*3/uL (ref 0.1–1.0)
Neutro Abs: 11 10*3/uL — ABNORMAL HIGH (ref 1.7–7.7)
Neutro Abs: 15.9 10*3/uL — ABNORMAL HIGH (ref 1.7–7.7)
Neutrophils Relative %: 94 % — ABNORMAL HIGH (ref 43–77)

## 2010-07-02 LAB — BASIC METABOLIC PANEL
BUN: 11 mg/dL (ref 6–23)
CO2: 30 mEq/L (ref 19–32)
Calcium: 7.8 mg/dL — ABNORMAL LOW (ref 8.4–10.5)
Calcium: 8.4 mg/dL (ref 8.4–10.5)
Creatinine, Ser: 0.55 mg/dL (ref 0.4–1.2)
Creatinine, Ser: 0.73 mg/dL (ref 0.4–1.2)
Creatinine, Ser: 0.76 mg/dL (ref 0.4–1.2)
GFR calc Af Amer: >60 mL/min (ref >60)
GFR calc non Af Amer: >60 mL/min (ref >60)
GFR calc non Af Amer: >60 mL/min (ref >60)
Glucose, Bld: 92 mg/dL (ref 70–99)
Sodium: 142 mEq/L (ref 135–145)

## 2010-07-02 LAB — CBC
HCT: 28.1 % — ABNORMAL LOW (ref 36.0–46.0)
Hemoglobin: 9.2 g/dL — ABNORMAL LOW (ref 12.0–15.0)
MCHC: 32.3 g/dL (ref 30.0–36.0)
MCV: 96.2 fL (ref 78.0–100.0)
Platelets: 367 10*3/uL (ref 150–400)
Platelets: 437 10*3/uL — ABNORMAL HIGH (ref 150–400)
RBC: 2.9 MIL/uL — ABNORMAL LOW (ref 3.87–5.11)
WBC: 11.8 10*3/uL — ABNORMAL HIGH (ref 4.0–10.5)

## 2010-07-02 LAB — CLOSTRIDIUM DIFFICILE EIA

## 2010-07-02 LAB — FECAL LACTOFERRIN, QUANT: Fecal Lactoferrin: POSITIVE

## 2010-07-02 LAB — OVA AND PARASITE EXAMINATION

## 2010-07-02 LAB — COMPREHENSIVE METABOLIC PANEL
Albumin: 2.5 g/dL — ABNORMAL LOW (ref 3.5–5.2)
Alkaline Phosphatase: 47 U/L (ref 39–117)
BUN: 13 mg/dL (ref 6–23)
CO2: 36 mEq/L — ABNORMAL HIGH (ref 19–32)
Chloride: 100 mEq/L (ref 96–112)
Potassium: 3.3 mEq/L — ABNORMAL LOW (ref 3.5–5.1)
Total Bilirubin: 0.4 mg/dL (ref 0.3–1.2)

## 2010-07-02 LAB — STOOL CULTURE

## 2010-07-02 LAB — MAGNESIUM: Magnesium: 1.8 mg/dL (ref 1.5–2.5)

## 2010-07-02 LAB — GLUCOSE, CAPILLARY: Glucose-Capillary: 117 mg/dL — ABNORMAL HIGH (ref 70–99)

## 2010-07-02 LAB — CULTURE, BLOOD (ROUTINE X 2)

## 2010-08-22 NOTE — Consult Note (Signed)
NEW PATIENT CONSULTATION   Melanie [name], Melanie [name]  DOB:  01/07/1926                                       05/21/2008  ZOXWR#:[id number]   The patient presents today for evaluation of extracranial  cerebrovascular occlusive disease. She is a very pleasant 77 year old  white female with asymptomatic carotid bruits detected by Dr. Patrica Duel.  She subsequently underwent carotid duplex.  She had an  initial study in May of 2009 and a subsequent study in January 2010 with  some progression of stenosis.  She is here today with 2 days.  She lives  at home alone independently.  She does have COPD with home oxygen  therapy, but is able to do her usual activities without difficulty.  She  denies any cardiac difficulty.  Per her daughters today, she is having  some memory loss.  She specifically denies any amaurosis fugax,  transient ischemic attack, or stroke.   FAMILY HISTORY:  Negative for premature atherosclerotic disease.   SOCIAL HISTORY:  She is widowed.  She is retired.  She does not smoke,  having quit 8 years ago.  She does not drink alcohol.   REVIEW OF SYSTEMS:  CONSTITUTIONAL:  Her weight is reported at 100  pounds.  She is 5 feet 1 inch tall.  CARDIOVASCULAR:  She does report some chest pain, tightness, shortness  of breath with exertion.  RESPIRATORY:  Asthma and wheezing.  GI/GU:  She denies any GI symptoms or GU symptoms.  EXTREMITIES:  She does have pain in her feet with walking.  NEUROLOGIC:  She reports dizziness and headaches.  MUSCULOSKELETAL:  Arthritic joint pain.  PSYCH:  Depression and nervousness.   MEDICATION ALLERGIES:  Codeine, penicillin, sulfa, and morphine.   CURRENT MEDICATIONS:  Xanax for anxiety.  Amiodarone.  Percocet.   PHYSICAL EXAM:  Well-developed thin white female appearing stated age of  89.  Her blood pressure is 134/77 in the left arm, 149/77 in the right  arm, pulse 76, respirations 18.  She is grossly intact  neurologically.  She does have soft carotid bruits bilaterally.  Her radial pulses are 2+  bilaterally.  Heart is regular rate and rhythm.  Abdomen is soft.  Her  feet are warm.  She does have palpable posterior pulses bilaterally.   I reviewed her duplex with the patient and her family.  She does have  moderate stenosis in her right and lesser degree of stenosis in her left  carotid system.  She is right handed.  I discussed symptoms of carotid  disease with the patient and her family, and she will notify of  immediately should these occur.  I have recommended that we see her in 1  year with repeat carotid duplex to rule out any progression of  asymptomatic carotid disease.  I explained the indications for treatment  would be symptoms related to the carotids or progression of her  stenosis.  She understands and we will see her in 1 year.   Larina Earthly, M.D.  Electronically Signed   TFE/MEDQ  D:  05/21/2008  T:  05/24/2008  Job:  [id number]   cc:   Patrica Duel, M.D.

## 2010-08-22 NOTE — H&P (Signed)
NAME:  Melanie [name], Melanie [name]                ACCOUNT NO.:  [id number]   MEDICAL RECORD NO.:  [id number]          PATIENT TYPE:  INP   LOCATION:  A301                          FACILITY:  [facility]   PHYSICIAN:  Dorris Singh, DO    DATE OF BIRTH:  Nov 20, 1925   DATE OF ADMISSION:  08/11/2007  DATE OF DISCHARGE:  LH                              HISTORY & PHYSICAL   PRIMARY CARE PHYSICIAN:  Patrica Duel, M.D.   CHIEF COMPLAINT:  Fall.   HISTORY OF PRESENT ILLNESS:  She is a 77 year old female who presented  to the [facility] with a complaint of fall that had  occurred today.  On talking with the daughter who is the power of  attorney the patient had several falls over the last 2 weeks while she  is having construction done in her home.  The concern is that the  daughter works all day as well, and there is no one at home to assist  her mother if this is to happen.  She fell this time, there was no loss  of consciousness, and she has been complaining of weakness; but has not  complained of confusion, abdominal pain, dizziness, nausea, or vomiting.   PAST MEDICAL HISTORY SIGNIFICANT FOR:  1. COPD.  2. Atrial fibrillation.  3. CAD.  4. She has had a total hip replacement.   SOCIAL HISTORY:  She is nonsmoker, nondrinker, and currently lives with  her daughter.   ALLERGIES:  She has multiple allergies to CODEINE, PENICILLIN, MORPHINE  and SULFA.   MEDICATIONS INCLUDE:  1. Amiodarone 200 mg b.i.d.  2. Aspirin 81 mg once a day.  3. Prilosec 20 mg once a day.  4. Xanax there is a specialized dose for that.  5. Pulmicort specialized dose as well.   REVIEW OF SYSTEMS:  Positive for weakness.  Negative for appetite  changes.  EYES:  Negative for diplopia or visual changes.  EARS, NOSE,  MOUTH AND THROAT:  Negative for ear pain, changes in smell, or sore  throat.  CARDIOVASCULAR:  Negative for chest pain or palpitations.  RESPIRATORY:  Negative for cough and dyspnea.   GASTROINTESTINAL:  Negative for abdominal pain, nausea, vomiting, fever, or chills.  MUSCULOSKELETAL:  Positive for weakness and of myalgias.  SKIN:  Negative for any tears.  NEURO:  Negative for dizziness, weakness, or  loss of consciousness.   PHYSICAL EXAM:  VITALS:  As follows, blood pressure 148/63, pulse rate  82, respirations 20, temperature 98.1.  Her pulse oximetry is 100%.  GENERALLY:  The patient is an 77 year old Caucasian female who looks her  stated age.  She is well-developed, well-nourished in no acute distress.  HEAD:  Normocephalic, atraumatic.  EYES:  The patient currently wears glasses.  They are PERLA.  No scleral  icterus or conjunctival injection.  NOSE:  Turbinates are moist.  There is a nasal cannula.  THROAT:  No exudate or erythema noted.  NECK:  Supple.  There is no lymphadenopathy noted on palpation.  CARDIOVASCULAR:  Regular rate and rhythm.  No murmur noted.  RESPIRATORY:  Clear to auscultation bilaterally.  No rales, wheezes, or  rhonchi.  ABDOMEN:  Soft, nontender, nondistended.  Bowel sounds in all 4  quadrants.  EXTREMITIES:  Right leg is tender with hip movement.  NEURO:  A plus O x3.  Cranial history of II-XII are grossly intact.  SKIN:  There is no rash, no ecchymoses, or cyanosis noted.   LABS:  Her EKG showed normal sinus rhythm, right axis deviation.  Her  hip x-ray showed diffuse osteopenia with questionable nondisplaced right  pubic rami fracture.  Her sodium 145, potassium 3.4, chloride 92, carbon  dioxide 45, glucose 87, BUN 10, creatinine is 0.58.  her liver enzymes  are within normal limits.  Her white count of 6.8, hemoglobin 9.2,  hematocrit 28.9, platelet count is 210.   ASSESSMENT/PLAN:  1. Right inferior pubic rami fracture.  2. Chronic obstructive pulmonary disease with exacerbation.  3. History of falls.   PLAN:  1. Will go ahead and admit the patient to the service of [facility].  2. Will have orthopedic surgery come and  assess the patient at this      point in time.  3. We will also repeat blood work and get a UA to assess why the      patient is falling.  We will also have PT/OT come in and see the      patient as well.  Will do DVT and GI prophylaxis.  Will place the      patient on her home medications.  Will also place her on nebulizer      treatments, and will continue to monitor her and make changes as      necessary.  We will have case management evaluate the patient for      home health care.      Dorris Singh, DO  Electronically Signed     CB/MEDQ  D:  08/11/2007  T:  08/11/2007  Job:  [id number]

## 2010-08-22 NOTE — Discharge Summary (Signed)
NAME:  Melanie [name], Melanie [name]                ACCOUNT NO.:  [id number]   MEDICAL RECORD NO.:  [id number]          PATIENT TYPE:  INP   LOCATION:  A301                          FACILITY:  [facility]   PHYSICIAN:  Dorris Singh, DO    DATE OF BIRTH:  11-02-1925   DATE OF ADMISSION:  08/11/2007  DATE OF DISCHARGE:  LH                               DISCHARGE SUMMARY   ADDENDUM:  We will send [name] home on 2 medications, Cardizem 120  LA, take 1 tablet p.o. daily and KCl 20 mEq daily.  Her discharge  instructions as mentioned before.  Also due to the patient's  noncompliance in the past, it has been noted that it is very important  for her to followup with Cardiology as recommended.  We also try to set  up Home Health Care for her and offered a skilled nursing facility, but  the patient has adamantly refused both options.  Her condition is still  stable and she will be discharged to home.      Dorris Singh, DO  Electronically Signed     CB/MEDQ  D:  08/14/2007  T:  08/14/2007  Job:  [id number]

## 2010-08-22 NOTE — Consult Note (Signed)
NAME:  Melanie [name], Melanie [name]                ACCOUNT NO.:  [id number]   MEDICAL RECORD NO.:  [id number]          PATIENT TYPE:  INP   LOCATION:  A301                          FACILITY:  [facility]   PHYSICIAN:  J. Darreld Mclean, M.D. DATE OF BIRTH:  20-Dec-1925   DATE OF CONSULTATION:  DATE OF DISCHARGE:                                 CONSULTATION   This patient is seen at the request of Dr. Elige Radon.   The patient is an 77 year old female with a history of fall several days  ago.  Appears to get pain and tenderness in the hip area.  X-rays reveal  a possible spinal nondisplaced fracture of the right pubic ramus.  The  patient's bilateral total hips, which were unaffected from the fall.  There is no fracture.  No evidence of loosening.  I have seen the  patient.  She just went up walking.  She is doing well, has very little  pain, very little difficulty, very little discomfort.  Leg lengths are  equal.  Motion of the right hip is good.  She is able to void without  difficulty.  There are no other prior injuries.   IMPRESSION:  Nondisplaced fracture, right pubic ramus.   I discussed with her the findings, use a walker, weight bear as  tolerated.  I will see her in the office in 1 week.  I will set up a  followup appointment for next Tuesday at 2 o' clock on May 12.           ______________________________  J. Darreld Mclean, M.D.     JWK/MEDQ  D:  08/12/2007  T:  08/13/2007  Job:  [id number]

## 2010-08-22 NOTE — Discharge Summary (Signed)
NAME:  Melanie [name], Melanie [name]                ACCOUNT NO.:  [id number]   MEDICAL RECORD NO.:  [id number]          PATIENT TYPE:  INP   LOCATION:  A301                          FACILITY:  [facility]   PHYSICIAN:  Skeet Latch, DO    DATE OF BIRTH:  March 18, 1926   DATE OF ADMISSION:  08/11/2007  DATE OF DISCHARGE:  LH                               DISCHARGE SUMMARY   INTERIM DISCHARGE SUMMARY:   INTERIM DISCHARGE DATE:  Aug 13, 2007.   DISCHARGE DIAGNOSES:  1. Right inferior pubic rami fracture.  2. Obstructive pulmonary disease exacerbation.  3. History of falls.  4. Tachycardia.  5. Hypokalemia, resolved.  6. History of atrial fibrillation.  7. History of coronary artery disease.   BRIEF HOSPITAL COURSE:  This is a 77 year old Caucasian female who  presented with complaint of a fall that occurred.  Apparently the  patient had several falls over the last few weeks while having  construction done at home.  This was concerning the daughter that there  was no one at home to assist the patient.  The patient fell.  There was  no loss of consciousness.  She was complaining of weakness but no  confusion, abdominal pain, chest pain, nausea or vomiting.  The patient  had a right hip x-ray done on admission that showed a question  nondisplaced right inferior pubic ramus fracture with diffuse  osteopenia.  Secondary to this, orthopedic surgery was consulted and  recommended weightbearing as tolerated, also follow-up in the office  next week.  Physical therapy has been working with the patient.  Today  the patient's mobility has improved.  She was not complaining of any  pain, but patient was getting slowly nauseous on ambulation, but seems  like patient was doing fairly well.   The patient had an episode increased heart rate, and decreased potassium  yesterday.  This was replaced.  Her heart rate returned back to normal  limits.  Cardiology has been consulted at this time.  We waiting  cardiology  recommendations.  The patient has been continued on DVT and  GI prophylaxis during her hospital stay.  She has been on nebulizer  treatments for her of chronic COPD.  The patient had some altered mental  status changes.  CT of her head without contrast was negative for bleed  or any acute process, atrophy and nonspecific white matter changes.  At  this time is pending her cardiology consult.  If no intervention is  needed, the patient will be discharged to home.   VITALS ON DISCHARGE:  Temperature is 97.6, pulse 84, respirations 20,  blood pressure 144/65.   LABORATORY DATA:  Total creatinine kinase 85, CK-MB 4.4, troponin 0.03.  BNP is 141, sodium 145, potassium 3.6, chloride 100, CO2 is 41, glucose  99, BUN 6, creatinine 0.56.  White count 5.9, hemoglobin 8.3, hematocrit  25.2, platelet count 210.   MEDICATIONS AT DISCHARGE:  1. Alprazolam 2 mg one half to one tablet three times a day as needed.  2. Amiodarone 200 mg b.i.d.  Pulmicort 0.5 mg/2 mL twice daily.  1.  Ipratropium bromide 0.02% four times a day.  2. Aspirin 81 mg daily.  3. Omeprazole 20 mg daily.   CONDITION ON DISCHARGE:  Stable.   DISPOSITION:  Patient discharged to home.   DISCHARGE INSTRUCTIONS:  The patient to follow up with Dr. Nobie Putnam in  the next 5-7 days.  She is also to follow up with Dr. Hilda Lias on Aug 19, 2007, at 2:00 p.m.  Maintain low-sodium heart-healthy diet.  The patient  may need home health upon discharge for ambulation.      Skeet Latch, DO  Electronically Signed     SM/MEDQ  D:  08/13/2007  T:  08/13/2007  Job:  [id number]   cc:   Patrica Duel, M.D.  Fax: [phone]

## 2010-08-25 NOTE — Procedures (Signed)
   NAME:  Melanie [name], Melanie [name]                            ACCOUNT NO.:  [id number]   MEDICAL RECORD NO.:  [id number]                  PATIENT TYPE:   LOCATION:                                       FACILITY:   PHYSICIAN:  Edward L. Juanetta Gosling, M.D.             DATE OF BIRTH:   DATE OF PROCEDURE:  DATE OF DISCHARGE:                                EKG INTERPRETATION   INTERPRETATION:  The rhythm is an atrial fibrillation with a ventricular  response around 180.  There are ST-T wave abnormalities which could indicate  injury or perhaps digitalis effect.   IMPRESSION:  Abnormal electrocardiogram.      ___________________________________________                                            Oneal Deputy. Juanetta Gosling, M.D.   ELH/MEDQ  D:  02/01/2003  T:  02/01/2003  Job:  [id number]

## 2010-08-25 NOTE — Procedures (Signed)
NAME:  [name], NINE NO.:  [id number]   MEDICAL RECORD NO.:  [id number]          PATIENT TYPE:  INP   LOCATION:  A217                          FACILITY:  [facility]   PHYSICIAN:  Nicki Guadalajara, M.D.     DATE OF BIRTH:  1925/05/06   DATE OF PROCEDURE:  07/19/2005  DATE OF DISCHARGE:                                  ECHOCARDIOGRAM   INDICATIONS:  Miss Melanie [name] is a 77 year old patient of Dr. Nobie Putnam and  Dr. Alanda Amass with a history of COPD, atrial fibrillation referred for 2-D  echo Doppler study.   1.  This was an adequate, comprehensive 2-D echocardiogram Doppler study.  2.  There was evidence for moderate left ventricle hypertrophy with wall      thickness measuring 1.4 cm septally and 1.2 cm posteriorly.  3.  Left ventricular end-diastolic and end-systolic dimensions were normal      at 3.6 and 2.4 cm, respectively.  4.  Systolic function was normal with an estimated ejection fraction of      approximately 55%.  There were no obvious focal segmental wall motion      abnormalities.  There was a suggestion of early diastolic relaxation      impairment.  5.  Left atrial dimension was normal at 3.5 cm.  Right atrium was normal.      Interatrial septum appeared intact.  6.  Aortic root dimension was normal at 3.0 cm.  7.  There was mild to moderate aortic valve sclerosis without stenosis.      There was no aortic insufficiency.  8.  There was mild to moderate mitral annular calcification.  There was      trivial mitral regurgitation.  Tricuspid valve was structurally normal.  9.  Pulmonic valve was not well-visualized.  10. There were no intramyocardial masses, thrombi or effusions seen.   IMPRESSION:  Technically that of an echocardiogram Doppler study  demonstrating mild to moderate left ventricular hypertrophy with normal  systolic function but suggestion of early diastolic relaxation impairment.  There was a mild to moderate aortic valve sclerosis without  stenosis,  evidence for mitral annular calcification with trivial mitral regurgitation.           ______________________________  Nicki Guadalajara, M.D.     TK/MEDQ  D:  07/19/2005  T:  07/19/2005  Job:  [id number]   cc:   Patrica Duel, M.D.  Fax: [phone]   Richard A. Alanda Amass, M.D.  Fax: [phone]

## 2010-08-25 NOTE — Procedures (Signed)
   NAME:  Melanie [name], Melanie [name]                          ACCOUNT NO.:  [id number]   MEDICAL RECORD NO.:  [id number]                   PATIENT TYPE:  INP   LOCATION:  IC07                                 FACILITY:  [facility]   PHYSICIAN:  Vida Roller, M.D.                DATE OF BIRTH:  February 08, 1926   DATE OF PROCEDURE:  01/29/2003  DATE OF DISCHARGE:                                  ECHOCARDIOGRAM   TAPE NUMBER:  [id number].   TAPE COUNT:  [phone].   INDICATIONS FOR PROCEDURE:  A 77 year old woman with atrial fibrillation.  This is assessment for left ventricular function.   TECHNICAL QUALITY:  Technical quality of the study is poor. This was done in  the Intensive Care Unit, bedside.   M-MODE TRACINGS:  Difficult to interpret.   2-D ECHOCARDIOGRAM:  2-D echocardiogram shows preserved left ventricular  systolic function with no obvious wall motion abnormalities. The right  ventricle is mildly dilated. There is evidence of increased right sided  pressures with an inferior vena cava which is dilated. Tricuspid  regurgitation jet was not adequately assessed but there appears to be at  least mildly to moderately increased right ventricular systolic pressure.  Both atria appear to be mildly enlarged as well. There is no obvious aortic  stenosis and no significant mitral regurgitation.      ___________________________________________                                            Vida Roller, M.D.   JH/MEDQ  D:  01/29/2003  T:  01/29/2003  Job:  [id number]

## 2010-08-25 NOTE — Group Therapy Note (Signed)
   NAME:  NATASHIA, ROSEMAN                          ACCOUNT NO.:  [id number]   MEDICAL RECORD NO.:  [id number]                   PATIENT TYPE:  INP   LOCATION:  IC07                                 FACILITY:  [facility]   PHYSICIAN:  Edward L. Juanetta Gosling, M.D.             DATE OF BIRTH:  1925-09-02   DATE OF PROCEDURE:  02/02/2003  DATE OF DISCHARGE:                                   PROGRESS NOTE   SUBJECTIVE:  Ms. Pedregon is sluggish this morning.  She does respond somewhat.   OBJECTIVE:  Otherwise, her physical exam shows that her blood pressure is  128/64, pulse is about 90, looks like it is still sinus rhythm, O2  saturation is 98%, her chest is fairly clear.   ASSESSMENT:  Basically she seems to be doing well.  She remains in sinus  rhythm.   PLAN:  I do not have any new plans as far as her respiratory situation is  concerned.  That seems to be fairly stable at this point and I will plan to  follow more peripherally now.      ___________________________________________                                            Oneal Deputy. Juanetta Gosling, M.D.   ELH/MEDQ  D:  02/02/2003  T:  02/02/2003  Job:  [id number]

## 2010-08-25 NOTE — H&P (Signed)
NAME:  Melanie [name], Melanie [name]                ACCOUNT NO.:  [id number]   MEDICAL RECORD NO.:  [id number]            PATIENT TYPE:   LOCATION:                                 FACILITY:   PHYSICIAN:  Darcella Gasman. In[place], N.P.  DATE OF BIRTH:  08/02/1925   DATE OF ADMISSION:  10/04/2005  DATE OF DISCHARGE:                                HISTORY & PHYSICAL   DATE OF BIRTH:  February 13, 1926.   CHIEF COMPLAINT:  She has fallen eight times without warning, she does not  feel well, she is short of breath, she is having lower extremity edema and  chest discomfort.   HISTORY OF PRESENT ILLNESS:  This 77 year old, white, widowed female patient  of Dr. Alanda Amass was originally admitted to [facility], July 17, 2005, with  rapid A-fib.  She converted, I believe, with IV Cardizem and was discharged  on Coumadin.  She felt the Coumadin was causing nausea and says she stopped  it and has declined to go back on Coumadin.  She was then readmitted on Aug 16, 2005, by Dr. Domingo Sep with chest pain and normal troponin as well as  atrial-fib with rapid ventricular response.  She underwent cardiac  catheterization at that point by Dr. Jenne Campus that showed significant  coronary artery disease, 100% RCA at the mid with diffuse disease, but left-  to-right collaterals.  She had an 80% circumflex lesion that he did a PTCA  and stent, non-drug-eluting.  She also has a 70% diag lesion, and the LV was  normal.  The patient went home and has not done very well.  Her daughter  feels like she is no better now than she had been in April and they are  quite frustrated.  She thinks she falls after she takes the Diltiazem.  She  has fallen eight times.  Her daughter thinks her speech has been somewhat  slurred since the hospital, and she wants to sleep all the time and has no  energy.  She is also complaining of lower extremity edema and she has never  done that before.   OTHER PAST MEDICAL HISTORY:  1.  COPD.  She had actually  weaned herself off the home oxygen, but now she      is on oxygen 24/7.  2.  Osteoarthritis.  3.  Chronic anemia.  4.  Atrial flutter with 2:1 block.  5.  An abdominal aortic aneurysm measuring 4.2 cm at ultrasound during that      hospitalization of previous and is currently now 3.6.  she had been      previously well controlled on dig and Cardizem, but now is not.  The      patient had self-discontinued Cardizem as well as Coumadin.  6.  History of proximal right ICA stenosis.  7.  History of iron deficiency anemia.  8.  History of anemia of chronic disease.  9.  History of diverticulosis.  10. History of bilateral hip replacements.  11. History of surgery for bilateral radial fractures.  12. Status post appendectomy.  13. Status post  BSO.  14. Status post hysterectomy.   ALLERGIES:  1.  CODEINE.  2.  SULFA.  3.  PENICILLIN.  4.  MORPHINE.  Morphine actually causes anaphylaxis.   OUTPATIENT MEDICATIONS:  1.  Xanax 1 mg three to four times a day as needed.  2.  O2 four liters nasal cannula.  3.  AcipHex  20 mg daily.  4.  Cardizem-CD 180 daily.  5.  Simvastatin 20 daily.  6.  Aspirin 81 daily.  7.  Plavix, I believe she has completed her dose.  8.  Nitroglycerin p.r.n.  9.  Lopressor 25 b.i.d.   FAMILY HISTORY:  Noncontributory to this admission.  Please note, her father  died of MI at 25 and a sister died at 18 with an MI.   SOCIAL HISTORY:  She is retired from [employer].  Denies  alcohol use.  She has a supportive daughter.  She has no grandchildren.  She  is widowed.  She lives in her own home but has been staying with her  daughter currently.  It has been very frustrating.  Her  daughter works so  it is difficult for her to keep up with her.  She does not use tobacco or  alcohol.   EKG today shows atrial flutter, heart rate 148, basically a 2:1 flutter.   VITAL SIGNS:  Blood pressure in the right arm 90/68, weight 115, height 5  feet 2, heart rate  148.   PHYSICAL EXAMINATION:  GENERAL:  Alert, oriented, fragile, white female, no  acute distress currently while sitting on the exam table.  SKIN:  Warm and dry, brisk capillary refill.  HEENT:  Sclerae are clear.  NECK:  Supple with no JVD.  I did not hear any bruits.  LUNGS:  Fairly clear, no wheezes today, no crackles.  HEART:  S1, S2, regular but rapid.  ABDOMEN:  Soft and nontender, positive bowel sounds, did not palpate aorta.  EXTREMITIES:  No lower extremity edema.  Pedal pulses are present.   ASSESSMENT:  1.  2:1 atrial flutter, recurrent.  2.  Frequent falls.  3.  Transient ischemic attack with frequent falls and atrial flutter without      anticoagulation, questionable embolic source.  4.  Rule out myocardial infarction with chest pressure as well as irregular      heartbeat.  5.  Chronic obstructive pulmonary disease, home oxygen use.  6.  History of hypertension.  7.  Please note, the patient has INTOLERANCE TO NUMEROUS MEDICATIONS, which      has been difficult to help.   PLAN:  Will admit her to [facility] today, telemetry bed, IV heparin, IV  amiodarone, and Dr. Jenne Campus will make further decisions, will also rule out  MI with serial CK-MB's, and an EKG in the morning.  I am dictating this for  Dr. Lenise Herald, who has seen and examined the patient.      Darcella Gasman. Annie Paras, N.P.     LRI/MEDQ  D:  10/04/2005  T:  10/04/2005  Job:  [id number]   cc:   Darlin Priestly, MD  Fax: [phone]   Patrica Duel, M.D.  Fax: [phone]   Richard A. Alanda Amass, M.D.  Fax: [phone]

## 2010-08-25 NOTE — Consult Note (Signed)
NAME:  [name], [name]                          ACCOUNT NO.:  [id number]   MEDICAL RECORD NO.:  [id number]                   PATIENT TYPE:  INP   LOCATION:  A204                                 FACILITY:  [facility]   PHYSICIAN:  R. Roetta Sessions, M.D.              DATE OF BIRTH:  Aug 25, 1925   DATE OF CONSULTATION:  07/20/2003  DATE OF DISCHARGE:                                   CONSULTATION   REASON FOR CONSULTATION:  Acute on chronic anemia.   HISTORY OF PRESENT ILLNESS:  Melanie [name] is a 77 year old lady with  a history of coronary disease and COPD.  She was admitted to the hospital on  July 12, 2003 with a left hip fracture.  She developed a fracture around her  previous prosthesis.  Her hospital course has been fairly stable.  She was  getting ready to go to rehab.  Repeat CBC revealed a drop in H&H down to the  8 range for which she is now receiving 2 units of packed red blood cells.  She has not had any melena nor rectal bleeding.  Hemoccult status unknown.  It is notable that she has never had a colonoscopy.  There is no family  history of colorectal neoplasia.  She has also had chronic symptoms of  sickness she describes as nausea over the past several weeks.  In fact,  she has been scheduled to see [place] in our office, but did not keep her  appointments on at least 2 occasions.  She has been taking Fosamax.  She has  no history of peptic ulcer disease.   PAST MEDICAL HISTORY:  Significant for coronary artery disease, history of  COPD, history of osteoporosis, degenerative joint disease.   PAST SURGERIES:  She has had both feet operated on previously, right hip,  both elbows, left shoulder.  She has had a complete hysterectomy and  appendectomy.   MEDICATIONS ON ADMISSION:  Cardizem, Lopressor, Demerol, Xanax.   ALLERGIES:  She is reportedly allergic to IRON, according to her daughter.   FAMILY HISTORY:  No history of chronic GI or liver illness.   SOCIAL HISTORY:  The  patient has a long smoking history, no alcohol.  She is  retired from [employer].  She has 1 daughter who is very  supportive.   REVIEW OF SYSTEMS:  No recent odynophagia, dysphagia, early satiety.  Some  nausea and some vomiting.  No abdominal pain.  No melena or rectal bleeding.  No change in weight, no fever or chills.   PHYSICAL EXAMINATION:  GENERAL:  Reveals a pleasant, elderly lady,  conversant and alert in no acute distress.  VITAL SIGNS:  Temperature 98.2, pulse of 98, respiratory rate 20, blood  pressure 150/82.  SKIN:  Warm and dry.  No jaundice.  No cutaneous stigmata of chronic liver  disease.  HEENT:  No scleral icterus. Oral cavity is  edentulous, no lesions.  NECK:  JVD is not prominent.  CHEST:  Lungs are clear with distant breath sounds on auscultation.  CARDIAC:  Regular rate and rhythm without murmur, gallop, or rub.  ABDOMEN:  Nondistended, positive bowel sounds.  Soft, nontender, without  appreciable mass or organomegaly.  EXTREMITIES:  No edema.   From April 12th white blood count 5.6, H&H 8.9 and 27.0, MCV 78.2, platelet  count 291,000.  Sodium 12, potassium 4.1, chloride __________  glucose 93,  BUN 2, creatinine 0.6.  Alkaline phosphatase 52, GOT/GPT 24/14, total  protein 5.7, albumin 3.3.  Iron 50, iron-binding capacity 436, iron  saturation low at 11%, ferritin low normal at 24.   IMPRESSION:  Melanie [name] is a pleasant 77 year old lady admitted to  the hospital with a right hip fracture around the prosthesis.  She has done  very well.  She had a significant drop in her hemoglobin which necessitated  a transfusion.  No obvious gastrointestinal bleeding.  Iron studies indicate  a mixed picture of chronic disease versus an early iron deficiency picture.  We do not have any evidence of gastrointestinal bleeding at this time.  However, this lady has never had a colonoscopy.  Recent nausea on Fosamax is  also concerning.  She appears to be  medically quite stable at this time.   RECOMMENDATIONS:  1. I have offered Melanie [name] endoscopic evaluation prior to her leaving the     hospital and going to the rehab unit in the way of a colonoscopy and     upper endoscopy.  I discussed the potential risks, benefits, and     alternatives, at length, with Melanie [name] and her daughter, Ms. Spencer Peterkin     via telephone.  I have also discussed this approach with Dr. Nobie Putnam.     All parties are agreeable.  We will plan to perform EGD/colonoscopy on     July 21, 2003.  2. Further recommendations to follow.   I would like to thank Dr. Nobie Putnam for allowing me to see this nice lady  today.      ___________________________________________                                            Jonathon Bellows, M.D.   RMR/MEDQ  D:  07/20/2003  T:  07/21/2003  Job:  [id number]   cc:   Patrica Duel, M.D.  [address]  [place]  [place] [address]  Fax: [phone]   Vickki Hearing, M.D.  Fax: [phone]   R. Roetta Sessions, M.D.  [address]  [place]  [place] [address]  Fax: [phone]

## 2010-08-25 NOTE — Discharge Summary (Signed)
NAME:  Melanie [name], Melanie [name]                          ACCOUNT NO.:  [id number]   MEDICAL RECORD NO.:  [id number]                   PATIENT TYPE:  INP   LOCATION:  A215                                 FACILITY:  [facility]   PHYSICIAN:  Corrie Mckusick, M.D.               DATE OF BIRTH:  1926-03-18   DATE OF ADMISSION:  07/23/2003  DATE OF DISCHARGE:  07/26/2003                                 DISCHARGE SUMMARY   HISTORY OF PRESENTING ILLNESS AND PAST MEDICAL HISTORY:  Please see  admission H&P.   HOSPITAL COURSE:  A 77 year old female with severe chronic obstructive  pulmonary disease who was readmitted to the hospital the day after discharge  as she was transferred from [facility]. She had had reoccurrence of nausea and  vomiting.  She was also readmitted because her and her family did not like  [facility] and wanted her to come back into the hospital.  She was readmitted  with a GI consult obtained once again.  During the hospital stay it was  discovered that the Duragesic patch which had been placed was most likely  causing the nausea and the vomiting.  The patient had an ultrasound of the  gallbladder showing gallstones.  HIDA scan was set up as well.  LFTs,  amylase, and lipase were normal.   Again, the day after readmission the nausea was resolved and this was off  the Duragesic patch.  HIDA scan was obtained which showed normal function of  the gallbladder.  No cystic duct obstruction, but the ejection fraction of  the gallbladder was low.  Dr. Karilyn Cota felt like this was not leading to the  symptoms and did not want cholecystectomy.  The patient was ready for  discharge on July 26, 2003 with resolved nausea and vomiting.   DISCHARGE PHYSICAL:  VITAL SIGNS:  She is afebrile.  Vital signs stable.  GENERAL:  Pleasant female, joking, in no acute distress.  CHEST:  Clear to auscultation bilaterally.  CARDIOVASCULAR:  Regular rate and rhythm no murmurs.  ABDOMEN:  Soft and nontender.   DISCHARGE  MEDICATIONS:  1. Peri-Colace 1 p.o. b.i.d.  2. Parafon Forte 1 p.o. q.6h.  3. Xanax 2 mg p.o. t.i.d. p.r.n.  4. Aspirin 325 daily.  5. K-Dur 20 mEq daily as well as Aciphex 20 mg p.o. daily.   DISCHARGE INSTRUCTIONS:  1. She is to follow up in 3-4 days at [facility].  2. Follow up with Dr. Karilyn Cota in 1 week.   DISCHARGE CONDITION:  Improved and stable.    ___________________________________________                                         Corrie Mckusick, M.D.   JCG/MEDQ  D:  07/26/2003  T:  07/27/2003  Job:  [id number]

## 2010-08-25 NOTE — Discharge Summary (Signed)
[facility]  Patient:    Melanie [name], Melanie [name]                       MRN: [id number] Adm. Date:  [id number] Disc. Date: [id number] Attending:  Colbert Ewing Dictator:   Oris Drone. Petrarca, P.A.-C. CC:         Loreta Ave, M.D.                           Discharge Summary  ADMISSION DIAGNOSIS:  Advanced degenerative joint disease of the hip.  DISCHARGE DIAGNOSES: 1. Advanced degenerative joint disease of the hip. 2. Postoperative hemorrhagic anemia.  PROCEDURE:  Right total hip replacement.  HISTORY OF PRESENT ILLNESS:  This is a 77 year old white female with long-standing DJD of the right hip.  She has failed conservative treatment this time.  She is now having difficulty with her activities of daily living and is now indicated for a right total hip replacement.  HOSPITAL COURSE:  This is a 77 year old white female admitted Aug 14, 1999, after appropriate laboratory studies were obtained as well as 1 g of vancomycin IV on call at the operating room.  She was taken to the operating room where she underwent a right total hip replacement.  She tolerated the procedure well.  She was placed on heparin and Coumadin for prophylaxis or antithrombosis.  PT/OT rehabilitation consults were ordered.  Physical therapy for ambulation and touchdown weightbearing on the right was ordered.  She was placed with a Demerol PCA pump dosed by the pharmacist.  She had an unremarkable hospital course.  She did have some problems with hypokalemia treated with Micro-K 10 p.o. t.i.d.  Dressing was changed on May 9, showing a benign wound with no signs of infection.  Her Micro-K 10 was discontinued on May 10.  Home health was set up by the case manager.  She improved to the point where she was able to be discharged on May 11, in improved condition. EKG was noted to have a normal sinus rhythm.  Preoperative hemoglobin was 14.2, hematocrit 44.0, white count 9400 with  platelets 293,000.  Discharge hemoglobin was 9.3, hematocrit 27.2, white count 10,800 with platelets 233,000.  Preoperative chemistries showed sodium 140, potassium 4.3, chloride 100, CO2 30, glucose 91, BUN 10, creatinine 0.9, calcium 9.8.  Total protein was 6.9, albumin 4.1, AST 28, ALT 22, Alk phos 76, total bilirubin 0.4. Discharge chemistries showed sodium 138, potassium 3.8, chloride 101, CO2 30, glucose 101, BUN 8, creatinine 0.5, calcium 8.6.  Urinalysis showed a small amount of leukocyte esterase with no white cells, no red cells and no bacteria seen.  Blood type was A positive and antibody screen negative.  DISCHARGE MEDICATIONS: 1. Coumadin 2 mg daily as per [facility] protocol. 2. Parafon Forte one p.o. q.8h. p.r.n. spasms. 3. Demerol 50 mg one p.o. q.4h. p.r.n. pain.  ACTIVITY:  She will be up ambulating with touchdown weightbearing on the right.  FOLLOWUP:  Follow up instructions by physical therapy.  She will follow up with [place] in 10 days for staple removal.  CONDITION ON DISCHARGE:  Improved. DD:  09/11/99 TD:  09/13/99 Job: [id number]

## 2010-08-25 NOTE — Group Therapy Note (Signed)
   NAME:  Melanie [name], Melanie [name]                          ACCOUNT NO.:  [id number]   MEDICAL RECORD NO.:  [id number]                   PATIENT TYPE:  INP   LOCATION:  A206                                 FACILITY:  [facility]   PHYSICIAN:  Edward L. Juanetta Gosling, M.D.             DATE OF BIRTH:  December 28, 1925   DATE OF PROCEDURE:  DATE OF DISCHARGE:                                   PROGRESS NOTE   SUBJECTIVE:  [name] seems to be doing pretty well, and I am going to plan  to sign off at this point.   Thanks for allowing me to see her with you.       ___________________________________________                                            Oneal Deputy. Juanetta Gosling, M.D.   ELH/MEDQ  D:  02/03/2003  T:  02/03/2003  Job:  [id number]

## 2010-08-25 NOTE — H&P (Signed)
NAME:  Melanie [name], Melanie [name]                ACCOUNT NO.:  [id number]   MEDICAL RECORD NO.:  [id number]         PATIENT TYPE:  AMB   LOCATION:                                FACILITY:  [facility]   PHYSICIAN:  Vickki Hearing, M.D.DATE OF BIRTH:  1925-10-10   DATE OF ADMISSION:  DATE OF DISCHARGE:  LH                                HISTORY & PHYSICAL   CHIEF COMPLAINT:  Fracture, left distal radius.   HISTORY:  A 77 year old fractured her left wrist. She has a history of  coronary artery disease and COPD. She had a left hip prosthesis followed by  fracture treated with nonoperative treatment. She has osteoporosis,  degenerative joint disease. She has had surgery on both of her feet, right  hip as stated, both elbows, left shoulder. Had a hysterectomy and  appendectomy.   MEDICATIONS:  Takes Cardizem, Lopressor, Demerol, and Xanax.   ALLERGIES:  She is allergic to IRON.   FAMILY HISTORY:  Unremarkable.   SOCIAL HISTORY:  She is a smoker. Does not drink. Retired from [employer]. Her daughter who is with her now is supportive.   PHYSICAL EXAMINATION:  GENERAL:  Shows a pleasant elderly female, awake,  alert, and oriented x3. Afebrile. Regular pulse of 90, respiratory rate of  20, blood pressure 142/80.  SKIN:  Warm and dry. No jaundice.  HEENT:  Normal.  NECK:  Supple.  CHEST:  Clear.  HEART:  Rate and rhythm normal.  ABDOMEN:  Soft.  EXTREMITIES:  Left wrist is severely deformed and swollen and tender over  the fracture site.  NEUROVASCULAR:  Remains intact.   RADIOLOGIC FINDINGS:  Radiographs show a volar type Smith's fracture or  volar Barton's fracture. Recommend plating.   DIAGNOSIS:  Left distal radius fracture.   TREATMENT:  Open treatment and internal fixation, left wrist.     Stan   SEH/MEDQ  D:  02/17/2004  T:  02/17/2004  Job:  [id number]

## 2010-08-25 NOTE — Cardiovascular Report (Signed)
Melanie [name], Melanie [name]                ACCOUNT NO.:  [id number]   MEDICAL RECORD NO.:  [id number]          PATIENT TYPE:  INP   LOCATION:  3715                         FACILITY:  [facility]   PHYSICIAN:  Darlin Priestly, MD  DATE OF BIRTH:  [birth year]   DATE OF PROCEDURE:  08/17/2005  DATE OF DISCHARGE:                              CARDIAC CATHETERIZATION   PROCEDURE:  1.  Left heart catheterization.  2.  Coronary angiography.  3.  Left ventriculogram.  4.  Abdominal aortogram.  5.  Left circumflex.      1.  Percutaneous transluminal coronary balloon angioplasty.      2.  Placement of intra-coronary stent.   CARDIOLOGIST:  Lenise Herald, M.D.   COMPLICATIONS:  None.   INDICATIONS FOR PROCEDURE:  Ms. Easton is a 77 year old female patient of Dr.  Patrica Duel and Dr. Susa Griffins with a history of paroxysmal atrial  fibrillation, chronic obstructive pulmonary disease, history of known  peripheral vascular disease with a 57% right carotid stenosis as well as a  known 4.2 cm abdominal aortic aneurysm. She was admitted with atrial  fibrillation with rapid ventricular response and substernal chest pain. She  did have a mild elevation of her troponin and is now referred for a cardiac  catheterization to rule out significant coronary artery disease.   DESCRIPTION OF PROCEDURE:  After informed written consent, the patient was  brought to the cardiac catheterization lab. The right groin was shaved,  prepped, and draped in the usual sterile fashion. ECG monitor was  established. Using modified Seldinger technique, #6 French arterial sheath  was inserted in the right femoral artery. A 6 French diagnostic catheter was  used to perform diagnostic angiography.   The left main is a large vessel with no significant disease.   The LAD is a large vessel, which coursed the apex using 1 large diagonal  branch. The LAD is coursed and irregular with up to 30% throughout its mid  and distal  segment but no high grade stenosis.   The first diagonal is a medium to large size vessel with a 70% proximal  lesion.   The left circumflex is a large vessel, which coursed the AV groove and gives  rise to one large bifurcating obtuse marginal. The AV groove circumflex is  noted to have 60% early mid with an 80% lesion in the mid segment, extending  into the first OM. There is a small aneurysm present. The continuation of  the AV groove circumflex is a small vessel, which gives collaterals to the  distal RCA, PD, and posterolateral branch.   The first OM is a medium to large size vessel, which bifurcates in its  proximal segment with 80% lesion at the ostium but no further significant  disease. There are left to right collaterals in the distal OM as well as a  large septal perforator and LAD.   The RCA is a medium to large size vessel, which is calcified in its ostial  segment with 70% ostial lesion. There is a mid 80% and then total occlusion  in an aneurysmal  segment of the mid RCA. As  previously stated, the distal  RCA as well as the PD and posterolateral branch fill via left to right  collaterals. There is diffuse disease of the distal RCA, PD, and  posterolateral branch.   Left ventriculogram reveals a preserved ejection fraction of approximately  50% with mild inferior hypokinesis.   HEMODYNAMIC SYSTEM:  Arterial pressure 154/63, LV systemic pressure 150/15,  LV DP of 25.   INTERVENTIONAL PROCEDURE:  Left circumflex - mid:  Following diagnostic  angiography, a #6 [place] guiding catheter was engaged in the left  coronary ostium. A 0.014 __________ guiding catheter and positioned across  the mid AV groove stenotic lesion. Measurements were obtained. The guidewire  was then positioned in the distal OM without difficulty. A Maverick 3.0 x 15  mm balloon was then used to cross the mid circumflex stenotic lesion, two  inflations to a maximum of 10 atmospheres were performed  for a total of 48  seconds. Followup angiogram revealed excellent luminal gain. This balloon  was then removed and a Lieberte 3.0 x 20 mm stent was then positioned across  the mid AV groove stenotic lesion. This stent was then deployed to 10  atmospheres for a total of 60 seconds. A second inflation to 12 atmospheres  was then performed for a total of 22 seconds. Followup angiogram revealed no  evidence of dissection or thrombus with TIMI-3 flow to the distal vessel. IV  Angiomax was used throughout the case.   Final __________ angiogram revealed less than 10% residual stenosis in the  AV groove circumflex stenotic lesion with TIMI 3 flow to the distal vessel.  At this point, we elected to conclude the procedure. All balloons, wires,  and catheters were removed. Hemostat sheath was sewn in place and the  patient returned ___________ in stable condition.   CONCLUSION:  1.  Successful percutaneous transluminal coronary balloon angioplasty and      placement of a Lieberte 3.0 x 20 mm stent (non-DES) in the mid AV groove      circumflex stenotic lesion.  2.  Total occlusion of the right coronary artery with left to right      collaterals to diffusely diseased PD and posterolateral branch.  3.  Normal left ventricular systolic function with mild wall motion      abnormalities noted above.  4.  Elevated LVP.  5.  Residual 70% proximal diagonal disease.  6.  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  7.  An 80% right ostial iliac stenosis.  8.  __________ of Angiomax infusion.      Darlin Priestly, MD  Electronically Signed     RHM/MEDQ  D:  08/17/2005  T:  08/19/2005  Job:  [id number]   cc:   Patrica Duel, M.D.  Fax: [phone]   Richard A. Alanda Amass, M.D.  Fax: [phone]

## 2010-08-25 NOTE — H&P (Signed)
NAME:  Melanie [name], Melanie [name]                          ACCOUNT NO.:  [id number]   MEDICAL RECORD NO.:  [id number]                   PATIENT TYPE:  INP   LOCATION:  A215                                 FACILITY:  [facility]   PHYSICIAN:  Corrie Mckusick, M.D.               DATE OF BIRTH:  [birth year]   DATE OF ADMISSION:  07/23/2003  DATE OF DISCHARGE:                                HISTORY & PHYSICAL   This is a readmission.  Patient was discharged yesterday.   ADMISSION DIAGNOSIS:  Nausea.   Please see yesterday's discharge summary for details of the readmission.  Patient has been in the hospital prior to this admission for quite some  time.  She was readmitted from [facility] due to nausea.   PAST MEDICAL HISTORY:  Please see recent H&P from last admission.   PAST SURGICAL HISTORY:  Please see recent H&P from last admission.   MEDICATIONS:  Please see discharge from yesterday and admitting medicines  from [facility].   PHYSICAL EXAMINATION:  VITAL SIGNS:  Temp 98.1, blood pressure 166/76, pulse  112, respiratory rate 22.  GENERAL:  When I saw her, she was pleasant, joking, and in no pain.  HEENT:  Nasopharynx and oropharynx are clear.  Moist mucous membranes.  NECK:  No JVD.  LUNGS:  Decreased breath sounds throughout, which is baseline for the  patient.  No rales.  No rhonchi.  HEART:  Regular rate and rhythm.  Normal S1 and S2.  ABDOMEN:  Soft.  Nontender.  EXTREMITIES:  No edema.   LABS:  White count 8.3, hemoglobin 14.4, hematocrit 43.3.  LFTs normal.   ASSESSMENT/PLAN:  1. A 77 year old female with severe chronic obstructive pulmonary disease,     who was recently in the hospital and was transferred to [facility] yesterday     and who has recurrence with nausea and vomiting.  Readmit with the same     orders she had yesterday.  2. Hold Duragesic patch.  3. Ultrasound of the abdomen pending.  4. Suspect this could be gallbladder disease.  Will set up for HIDA scan in     the  morning.  LFTs, amylase, and lipase in the morning.  5. Consult Dr. Karilyn Cota.    ___________________________________________                                         Corrie Mckusick, M.D.   JCG/MEDQ  D:  07/23/2003  T:  07/23/2003  Job:  [id number]

## 2010-08-25 NOTE — Procedures (Signed)
Melanie [name], Melanie [name]                ACCOUNT NO.:  [id number]   MEDICAL RECORD NO.:  [id number]          PATIENT TYPE:  OUT   LOCATION:  RAD                           FACILITY:  [facility]   PHYSICIAN:  Dunedin Bing, M.D.  DATE OF BIRTH:  16-Jan-1926   DATE OF PROCEDURE:  08/11/2004  DATE OF DISCHARGE:                                  ECHOCARDIOGRAM   REFERRING PHYSICIAN:  Patrica Duel, M.D.   CLINICAL DATA:  A 77 year old woman with syncope.   M-MODE:  Aorta 2.8.  Left atrium 2.7.  Septum 1.5.  Posterior wall 1.1.  LV  diastole 3.  LV systole 2.1.   RESULTS:  1.  A technically somewhat suboptimal but adequate echocardiographic study.  2.  Normal left and right atrial size.  Normal right ventricular size and      function.  Mild-to-moderate RVH.  3.  Mild mitral leaflet calcification with trace regurgitation.  Mild      annular calcification.  Mild-to-moderate aortic leaflet sclerosis.      Leaflet mobility is mildly impaired.  A suboptimal Doppler study      suggests no stenosis.  4.  Mild-to-moderate calcification of the aortic annulus and proximal      ascending aorta.  5.  Normal proximal pulmonary artery.  Pulmonic valve not well seen but      appears to be normal.  6.  Normal tricuspid valve.  Trivial regurgitation.  Estimated RV systolic      pressure not well defined but appears normal or near normal.  7.  Normal left ventricular size.  Mild hypertrophy with disproportionate      involvement of the septum.  Normal regional and global LV systolic      function.  8.  Normal IVC.  9.  Comparison with prior study of January 29, 2003:  Improved technical      quality of the present examination.  No left atrial, right atrial, right      ventricle, or IVC enlargement noted.  Pulmonary hypertension not      currently present.      RR/MEDQ  D:  08/11/2004  T:  08/11/2004  Job:  [id number]

## 2010-08-25 NOTE — Discharge Summary (Signed)
NAME:  JONATHAN, KIRKENDOLL                          ACCOUNT NO.:  [id number]   MEDICAL RECORD NO.:  [id number]                   PATIENT TYPE:  INP   LOCATION:  A204                                 FACILITY:  [facility]   PHYSICIAN:  Patrica Duel, M.D.                 DATE OF BIRTH:  12/24/1925   DATE OF ADMISSION:  07/12/2003  DATE OF DISCHARGE:  07/22/2003                                 DISCHARGE SUMMARY   DISCHARGE DIAGNOSES:  1. Periprosthetic fracture of the proximal femur with reasonable progress at     this point.  2. Severe osteoarthritis, status post bilateral hip arthroplasties.  3. Questionable history of arteriosclerotic cardiovascular disease.  4. Severe chronic obstructive pulmonary disease.  5. Anemia of undetermined etiology, status post esophagogastroduodenoscopy     and colonoscopy with polypectomy this admission.  Currently, hemoglobin     is 12.  6. Coumadin intolerance in the past.   HISTORY OF PRESENT ILLNESS:  For details regarding admission, please refer  to the admitting note.  Briefly, this 77 year old female with the above  history was doing well until she lost her footing and fell on her left  hip.  She experienced severe pain in her hip and was brought to the ER for  evaluation.  Workup there revealed fracture of the proximal femur and the  periprosthetic region medially.  Dr. Eulah Pont (the patient's orthopedic  surgeon who performed the surgery) stated that it was not a surgical problem  and that she should be admitted here for pain control and further evaluation  and therapy as indicated.  He declined to admit her to his facility and she  was admitted here for the above reasons.  There is no history of chest pain,  syncope, nausea, vomiting, diarrhea, melena, hematemesis, hematochezia or  urinary symptoms.  Respiratory status had been stable.   HOSPITAL COURSE:  The patient was placed on Lovenox empirically as well as  pulmonary toilet.  Dr. Romeo Apple was  consulted who felt that bed rest was our  only option at this time.  She experienced several days of marked confusion  compatible with sundowning syndrome.  A CT of the brain revealed atrophy  only.  Her respiratory status has remained stable and her mental status has  cleared remarkably well.  She is able to transfer from the bed to chair with  assistance.  She is able to bear minimal weight only.   The patient's hemoglobin fell steadily from 11.5 to approximately 8.5.  She  was transfused with two units of packed cells and Dr. Kendell Bane was consulted.  An upper endoscopy was performed which was benign.  A colonoscopy revealed a  small polyp which was removed.  She has had not recurrent bleeding or any  history of documented GI bleeding.   Currently, the patient is stable for discharge.  Hemoglobin is 12 and she  has had no bleeding.  Her mental status is normal and she is stable for  discharge.   DISCHARGE MEDICATIONS:  1. Aspirin 325 mg q.d.  2. Protonix 40 mg q.d.  3. Xopenex and Atrovent nebulizers q.i.d. and p.r.n.  4. Routine bowel care.  5. K-Dur 20 mEq q.d.  6. Duragesic patch 50 mcg q.h., change every three days.   SPECIAL INSTRUCTIONS:  TED hose will be applied.  Physical therapy will be  continued.   FOLLOW UP:  She will be followed and treated expectantly as an outpatient.  Dr. Romeo Apple will assist in her ongoing care.     ___________________________________________                                         Patrica Duel, M.D.   MC/MEDQ  D:  07/22/2003  T:  07/22/2003  Job:  [id number]

## 2010-08-25 NOTE — H&P (Signed)
[name], Melanie [name]                ACCOUNT NO.:  [id number]   MEDICAL RECORD NO.:  [id number]           PATIENT TYPE:   LOCATION:                               FACILITY:  [facility]   PHYSICIAN:  Dani Gobble, MD       DATE OF BIRTH:  January 28, 1926   DATE OF ADMISSION:  08/16/2005  DATE OF DISCHARGE:                                HISTORY & PHYSICAL   PRIMARY CARDIOLOGIST:  Dr. Pearletha Furl. Alanda Amass.   HISTORY OF PRESENT ILLNESS:  Ms. Melanie [name] is a pleasant 77 year old female who  is a medical patient of Dr. Nobie Putnam and a cardiology patient of Dr.  Alanda Amass, who is here today as an add-on.  She was recently in the hospital  in April of this year at [facility], where she was found to be in  atrial fibrillation with rapid ventricular response.  She was fairly easy to  control and actually it looked more like atrial flutter with 2:1 block than  atrial fibrillation.  Other past medical history includes COPD,  osteoarthritis, chronic anemia and an abdominal aortic aneurysm measured at  4.2 cm by ultrasound during that hospitalization, which has increased from  2005, when it was 3.6 cm.  She was fairly easy to rate-control on digoxin  and Cardizem, and was discharged to have outpatient followup with stress  test and she will need serial Dopplers to follow her AAA.  She also had an  echocardiogram during that admission which revealed moderate concentric LVH  and normal left ventricular size and overall ejection fraction estimated at  55% without obvious regional wall motion abnormalities.  She did have  diastolic dysfunction.   She presents to the office as an add-on.  She self-discontinued the Cardizem  because she felt this was causing her to be nauseated; however, the nausea  has persisted despite discontinuation of the Cardizem and this has resulted  in recurrence of her atrial fibrillation with rapid ventricular response.  She is holding her chest and describes chest pressure.  This  reproducible  on palpation.  She has baseline shortness of breath, but it is worse  recently with this increased heart rate.  She notes the palpitations.  She  has been dizzy, but without frank presyncope.   Carotid ultrasound in 2006 revealed a proximal right ICA stenosis of 50% to  70%.   PAST MEDICAL HISTORY:  1.  PAF with RVR.  2.  COPD.  3.  Osteoporosis.  4.  Osteoarthritis.  5.  Iron deficiency anemia and anemia of chronic disease.  6.  History of diverticulosis.  7.  History of bilateral hip replacements.  8.  History of surgery for bilateral radial fractures.  9.  She is status post appendectomy, BSO and hysterectomy.   ALLERGIES:  CODEINE, SULFA, PENICILLIN and MORPHINE; MORPHINE causes  anaphylaxis.   CURRENT MEDICATIONS:  1.  Xanax 1 mg t.i.d. to q.i.d.  2.  Oxygen at 4-L nasal cannula continuously at home.  3.  Digoxin 250 mcg daily.  4.  Aciphex 20 mg daily.  5.  Cardizem 300 mg, which  was, as noted above, self-discontinued.   SOCIAL HISTORY:  She is retired from [employer] and she denies  alcohol use.  She has 1 very supportive daughter.   FAMILY HISTORY:  Noncontributory to this admission.   REVIEW OF SYSTEMS:  GENERAL:  She has had a 4-1/2-pound weight loss.  MUSCULOSKELETAL:  As noted above.  SKIN:  Negative.  EYES:  Negative.  RESPIRATORY:  As outlined today.  CARDIOVASCULAR:  As outlined today.  GI:  She has had recent heme-positive stools and for this reason, she was not  started on Coumadin.  GU:  Negative.  ENDOCRINE:  She has had hypoglycemic  episodes, which are being evaluated by Dr. Nobie Putnam.  NEUROLOGIC:  As  outlined above.  ENT:  Negative.  HEMATOLOGIC/LYMPHATICS:  Positive for  anemia.   PHYSICAL EXAMINATION:  GENERAL:  Physical exam reveals an elderly, ill-  appearing, anxious white female.  She is holding her chest and describes  chest pressure.  VITAL SIGNS:  Height is 5-feet 2-inches tall.  Weight is 112-1/2 pounds,  down  from 117, last visit, which was in April.  Blood pressure 106/70,  previously 140s prior to her hospitalization.  Heart rate is 127, but  increases at times to approximately 150.  NECK:  Negative for JVD.  No obvious bruits are noted, although the exam is  somewhat difficult.  LUNGS:  Lungs reveal only fair overall air exchange.  She has mild-to-  moderate expiratory wheeze.  No crackles are appreciated.  CARDIAC:  Exam reveals an irregular rhythm with a tachycardic rate.  ABDOMEN:  Benign.  Positive bowel sounds.  EXTREMITIES:  The lower extremities are negative for edema.   LABORATORY AND ACCESSORY CLINICAL DATA:  Recent lab work revealed an insulin  level of 3, which is at the very lower limits of normal; which was done as  an outpatient from Aug 07, 2005.  Sodium 145, potassium 4.4, chloride 98,  bicarb 28, glucose 54, BUN 15, creatinine 0.6, total bilirubin 0.2, alkaline  phosphatase 57, AST 42, ALT 19, total protein 7.3, albumin 4.4, calcium 9.2.  TSH 1.389.  Digoxin level of 1.7.  I do not have a discharge summary  available, but enzymes were negative on her previous admission.   Chest x-ray from July 17, 2005:  COPD and chronic bronchitis, but negative  for acute.   MRI of the brain revealed atrophy and chronic microvascular ischemic  changes, appropriate for age.  She had a small sub-centimeter focus of  hemosiderin, left posterior fundal cortex, likely a cavernous angioma or  remote micro-bleed per the Radiology interpretation.  There was a previously  questioned area of hemorrhage in the left subfrontal region, but it was felt  to be artifactual and negative for acute stroke.   An EKG performed in the office today reveals at times what looks like an  AVNRT with retrograde P waves, but the remainder of the EKG is consistent  with atrial fibrillation.  The rhythm strip is also notable for the possibility of retrograde P waves and a possible AVNRT; however, I believe  this is  most likely atrial fibrillation with RVR.   IMPRESSION:  1.  Atrial fibrillation with rapid ventricular response with a rhythm strip      at time which appears to be more consistent with atrioventricular nodal      reentrant tachycardia.  2.  Chest pressure.  3.  Self-discontinuation of Cardizem with recurrence of her rapid  ventricular response.  4.  Chronic obstructive pulmonary disease, severe.  5.  Osteoarthritis.  6.  Osteoporosis.  7.  Echocardiogram with results as noted above.  8.  Multifactorial anemia secondary to iron deficiency as well as chronic      disease.  9.  History of diverticulosis.   RECOMMENDATIONS:  1.  Admit to [facility] (after discussion with Dr. Nobie Putnam) on a      rule-out-MI protocol.  2.  Guaiac all stools.  3.  Consider IV heparin pending the outcome of the stool guaiacs and lab      work.  4.  Lab work.  5.  Serial enzymes.  6.  Continue treatment for her severe COPD with Xopenex nebulizers, possibly      with Atrovent as well and O2.  7.  Chest x-ray.  8.  Start IV Cardizem with plan for changing to p.o. when heart rate      controlled.  9.  Continue digoxin.  10. Consider cardiac catheterization as the plan was previously for her to      have outpatient dobutamine Myoview; however, she did not make it to this      point, so we may need to consider cardiac cath, depending on her lab      work and her anemia and her enzymes.           ______________________________  Dani Gobble, MD     AB/MEDQ  D:  08/16/2005  T:  08/16/2005  Job:  [id number]   cc:   Patrica Duel, M.D.  Fax: [phone]   Richard A. Alanda Amass, M.D.  Fax: [phone]

## 2010-08-25 NOTE — Op Note (Signed)
NAME:  Melanie [name], Melanie [name]                ACCOUNT NO.:  [id number]   MEDICAL RECORD NO.:  [id number]          PATIENT TYPE:  INP   LOCATION:  A217                          FACILITY:  [facility]   PHYSICIAN:  R. Roetta Sessions, M.D. DATE OF BIRTH:  1925-06-24   DATE OF PROCEDURE:  07/20/2005  DATE OF DISCHARGE:                                 OPERATIVE REPORT   PROCEDURE:  Esophagogastroduodenoscopy.   INDICATIONS FOR PROCEDURE:  A 77 year old lady admitted to the hospital with  paroxysmal atrial fibrillation, COPD.  She has repeatedly hemoccult-positive  stool and acute on chronic anemia.  EGD is now being done to rule out peptic  ulcer disease, etc.  She has a history of peptic ulcer disease in the  distant past.  Approach to EGD has been discussed with the patient at  length.  Potential risks, benefits and alternatives have been reviewed.  Questions answered.  She is agreeable.   PROCEDURE NOTE:  O2 saturation, blood pressure, pulse of the patient were  monitored throughout the entire procedure.  Conscious sedation Versed and  Demerol in incremental doses.   INSTRUMENT:  Olympus video chip system.   FINDINGS:  Examination of the tubular esophagus revealed a non-critical  Schatzki's ring.  Esophageal mucosa otherwise appeared normal.  EG junction  was __________ traversed.   Stomach:  Gastric cavity was empty.  It insufflated well with air.  A  thorough examination of the gastric mucosa __________ retroflexed in the  proximal stomach.  The esophagogastric junction demonstrated a small hiatal  hernia and some submucosal petechiae in the fundus.  There was no erosion,  ulcer crater or infiltrating process seen.  The pylorus was patent and  easily traversed.  Examination of the bulb and second portion revealed no  abnormalities.   THERAPY/DIAGNOSTIC MANEUVERS PERFORMED:  None.   The patient tolerated the procedure well.   ENDOSCOPY IMPRESSION:  1.  Non-critical Schatzki's ring, not  manipulated.  2.  Normal esophagus.  3.  Small hiatal hernia.  4.  Submucosal petechiae in the fundus of uncertain significance, otherwise      normal gastric mucosa.  Patent pylorus.  Normal D1 and D2.   FINDINGS:  Really not very impressive as to the cause of her hemoccult-  positive stool and anemia.  Iron studies indicate some degree of iron  deficiency with serum iron of 44, iron binding capacity is 324, percent  saturation 14%.  Ferritin 21.  There appears to be an element of chronic  disease as well.   Her last colonoscopy was two years ago in the setting of a poor prep.  She  had one adenoma and one hyperplastic polyp.   RECOMMENDATIONS:  I feel she probably ought to have a colonoscopy at some  point in the near future.  Her cardiopulmonary status will be dependent on  that approach.   Will see how she does over the weekend and will decide on timing of  colonoscopy.   Dr. Cira Servant will see over the weekend as needed.      Jonathon Bellows, M.D.  Electronically Signed  RMR/MEDQ  D:  07/20/2005  T:  07/20/2005  Job:  [id number]   cc:   Patrica Duel, M.D.  Fax: [phone]   Dani Gobble, MD  Fax: [phone]

## 2010-08-25 NOTE — Procedures (Signed)
NAME:  Melanie [name], Melanie [name]                ACCOUNT NO.:  [id number]   MEDICAL RECORD NO.:  [id number]          PATIENT TYPE:  INP   LOCATION:  A217                          FACILITY:  [facility]   PHYSICIAN:  Edward L. Juanetta Gosling, M.D.DATE OF BIRTH:  [birth year]   DATE OF PROCEDURE:  DATE OF DISCHARGE:                                EKG INTERPRETATION   The rhythm is a supraventricular tachycardia with a rate in the 150s.  There  are areas that looks like it may be atrial flutter with block.  There are ST-  T wave abnormalities which are nonspecific.  Abnormal electrocardiogram.      Oneal Deputy. Juanetta Gosling, M.D.  Electronically Signed     ELH/MEDQ  D:  07/26/2005  T:  07/26/2005  Job:  [id number]

## 2010-08-25 NOTE — Discharge Summary (Signed)
   NAME:  Melanie [name], Melanie [name]                          ACCOUNT NO.:  [id number]   MEDICAL RECORD NO.:  [id number]                   PATIENT TYPE:  INP   LOCATION:  A312                                 FACILITY:  [facility]   PHYSICIAN:  Corrie Mckusick, M.D.               DATE OF BIRTH:  [birth year]   DATE OF ADMISSION:  09/23/2001  DATE OF DISCHARGE:  09/27/2001                                 DISCHARGE SUMMARY   HISTORY OF PRESENT ILLNESS:  Please see admission H&P.   PAST MEDICAL HISTORY:  Please see admission H&P.   HOSPITAL COURSE:  A 77 year old female with a long history of tobacco abuse  as well as COPD presented with shortness of breath and exacerbation of COPD.  She was begun on nebulizer treatments as well as Solu-Medrol. Chest x-ray on  admission showed multiple nodules and calcifications and a CT scan was  ordered.   On September 24, 2001, Dr. Regino Schultze saw the patient and she had still quite a bit  of wheezing. A CT of the chest was ordered. IV antibiotics and steroids were  continued at that time. ABG was 7.35, 51, 100 and 2 on 2 liters.   On the following day on September 25, 2001, I saw the patient and she had quite  improved breath sounds, less shortness of breath but was still weak. A chest  CT was ordered for that day. Prednisone was begun and Solu-Medrol was  discontinued, IV fluids were discontinued. The following day, the patient  continued to improved, vital signs stable, afebrile. Home nebulizer was set  up. Chest CT showed no masses.   On September 27, 2001, the patient felt quite well and appeared back to normal.  Vital signs remained stable.  Chest exam had much less wheezes, much better  breath sounds. She was set for discharge home with home nebulizer and was  off oxygen. Discharged on O2 at 2 liters, can use a nasal cannula with  phlegm care, Levaquin 500 daily for 7 days, albuterol and Atrovent nebs  q.i.d. and prednisone taper as directed. She is to follow-up with [facility] in  one week.   DISCHARGE PHYSICAL EXAM:  Please see progress note from the day of  discharge.                                               Corrie Mckusick, M.D.    Flint Melter  D:  10/30/2001  T:  11/08/2001  Job:  [id number]

## 2010-08-25 NOTE — Discharge Summary (Signed)
NAME:  Melanie [name], Melanie [name]                ACCOUNT NO.:  [id number]   MEDICAL RECORD NO.:  [id number]          PATIENT TYPE:  INP   LOCATION:  4732                         FACILITY:  [facility]   PHYSICIAN:  Cristy Hilts. Jacinto Halim, MD       DATE OF BIRTH:  Mar 06, 1926   DATE OF ADMISSION:  10/04/2005  DATE OF DISCHARGE:  10/06/2005                                 DISCHARGE SUMMARY   REASON FOR ADMISSION:  Ms. Melanie [name] is a 77 year old female, a patient  of Dr. Lenise Herald, who was originally admitted to [facility]  July 17, 2005 with rapid atrial fibrillation.  She was converted with IV  Cardizem and was discharged on Coumadin.  She felt it was causing nausea and  she said she stopped it, and has refused to go back on it.  She was admitted  on May 10th by Dr. Domingo Sep with chest pain.  She was transferred for  cardiac catheterization; and she was found to have coronary disease, at  which time she received PTCA and stent to an 80% circumflex.  This was a non-  drug-eluting stent.  She then went home and since that time she has become  weak, wanting to sleep all the time, and had at least eight falls.  Thus she  came in the hospital for reevaluation and treatment.   HISTORY OF PRESENT ILLNESS:  She was transferred to [facility] on 10/04/2005  where she was placed on IV amiodarone because she was in atrial fibrillation  with a rapid ventricular response.  She continued to refuse the Coumadin.  She was placed on IV heparin.  On 10/05/2005, she had two episodes of  nosebleeds; the last one was difficult to control.  Dr. Jacinto Halim did come to  see her.  He was about to pack her nose, and apparently stopped.  He decided  that she should go home on aspirin and Plavix, plus her amiodarone dose was  decreased on 10/06/2005, considered stable for discharge home.  She was in  sinus rhythm.   LABORATORY INVESTIGATIONS:  Sodium 144, potassium 4.2, glucose 108, BUN 16,  creatinine 0.7, SGOT 21, SGPT was 12,  magnesium was 2.2.  Hemoglobin was 10,  hematocrit 31.3, WBC 6.1, and platelets were 192,000.  CK-MB and troponins  were negative x three.  Her TSH was 0.959.   Her EKG showed sinus rhythm on October 05, 2005.  There is no chest x-ray in  the chart at the time of this dictation.   MEDICATIONS ON DISCHARGE:  Aspirin 81 mg one tablet per day; Plavix 75 mg  one tablet per day; Zocor 20 mg at bedtime; metoprolol 25 mg two x day; Os-  Cal 500 mg one time per day; amiodarone 200 mg two tablets per day which she  should be on for about one month, and then decrease to 200 mg once per day;  Asiphex 20 mg one tablet per day.  She is noted to take Cardizem.  She will  follow up with Dr. Jenne Campus in approximately two weeks.  They will give her  numbers to call for an appointment.   DISCHARGE DIAGNOSES:  1.  Paroxysmal atrial flutter with rapid ventricular response; placed on      amiodarone at discharge, was in sinus rhythm.  2.  Coronary artery disease with recent non-drug-eluting stenting to her      circumflex.  She does have residual disease of 70% in her diagonal.  3.  Normal left ventricular function.  4.  Atherosclerotic peripheral vascular disease with proximal right internal      carotid artery stenosis.  5.  History of anemia, prominent disease.  6.  Abdominal aortic aneurysm 4.2 cm.  7.  Chronic obstructive pulmonary disease, on home oxygen.  8.  Respiratory alkalosis with decreased flora.      Lezlie Octave, N.P.      Cristy Hilts. Jacinto Halim, MD  Electronically Signed    BB/MEDQ  D:  10/06/2005  T:  10/06/2005  Job:  [id number]   cc:   Patrica Duel, M.D.  Fax: [phone]

## 2010-08-25 NOTE — Consult Note (Signed)
NAME:  Melanie [name], Melanie [name]                ACCOUNT NO.:  [id number]   MEDICAL RECORD NO.:  [id number]          PATIENT TYPE:  INP   LOCATION:  A217                          FACILITY:  [facility]   PHYSICIAN:  R. Roetta Sessions, M.D. DATE OF BIRTH:  March 22, 1926   DATE OF CONSULTATION:  07/19/2005  DATE OF DISCHARGE:                                   CONSULTATION   REASON FOR CONSULTATION:  Anemia, hemoccult positive stool.   HISTORY OF PRESENT ILLNESS:  Miss Melanie [name] is a pleasant 77 year old  Caucasian female with history of COPD admitted to the hospital with  paroxysmal atrial fibrillation/mild congestive heart failure.   She has a long history of anemia.  She was found to have acute on chronic  anemia on admission.   She has been having brown stools and they now have been found to be  hemoccult positive.  This lady was briefly heparinized but heparin was  stopped once she was found to be hemoccult positive.   On admission, her H&H were 11.2 and 35 and MCV 90.5, July 18, 2005.  On  April 12, H&H 9.5 and 29.1.   Miss Melanie [name] has not experienced any melena or hematochezia.  No abdominal  pain.  No odynophagia, dysphagia, anxiety or reflux symptoms, nausea or  vomiting.  Her only complaint is shortness of breath which has been chronic.   Her past GI evaluation was lastly done back in April 2005 when she presented  with evidence of GI bleeding.  EGD and colonoscopy by me on July 21, 2003  demonstrated a nonobstructing Schatzki's ring, moderate sized hiatal hernia,  otherwise normal upper GI tract.  Colonoscopy revealed a normal rectum, left-  sided diverticula and two small polyps.  One turned out to be inflammatory  and one adenomatous.  Both were removed, however, colonoscopy was done in  the setting of a relatively poor prep.   She has a history of significant GI bleed (? gastric ulcer) back in 1995  when she was on Coumadin therapy.   Miss Melanie [name] tells me she takes an occasional Tylenol  but absolutely denies  any form of aspirin, nonsteroidals agent.   She was recently seen by Dr. Alanda Amass.   She is now being followed by Dr. Domingo Sep.  Her medications at home include  Xanax, home O2 and albuterol p.r.n.   PAST MEDICAL HISTORY:  1.  Coronary artery disease.  2.  COPD.  3.  Osteoporosis.  4.  Osteoarthritis,.  5.  Iron deficiency/anemia of chronic disease.  6.  History of diverticulosis.  7.  History of bilateral hip replacements.  8.  History of surgery for bilateral radial fractures.  9.  She has had surgery on her elbow and shoulders.  10. She is status post appendectomy, BSO and hysterectomy.   ALLERGIES:  PENICILLIN, SULFA, CODEINE.   FAMILY HISTORY:  Noncontributory.   SOCIAL HISTORY:  The patient is retired from [employer].  She  does not use alcohol.  She has one supportive daughter.   REVIEW OF SYSTEMS:  As outlined in history of present illness.  PHYSICAL EXAMINATION:  Reveals an alert and conversant, well-oriented lady  resting comfortably.  BP 144/58, pulse 87, O2 saturation 97% on 3 L nasal  prong O2.  Temperature 98.1.  SKIN:  Warm and dry.  There is no jaundice.  No continued stigmata of  chronic liver disease.  There is no scleral icterus.  Conjunctivae are  somewhat pale.  Oral cavity:  No lesions.  She is edentulous.  JVD is not  prominent.  CHEST/LUNGS:  She has some expiratory wheezes and distant breath sounds  bilaterally.  CARDIAC:  Exam regular rate and rhythm without appreciable murmur, gallop or  rub.  BREAST:  Exam deferred.  ABDOMEN:  Nondistended, positive bowel sounds, soft and nontender without  appreciable mass or organomegaly.  EXTREMITY:  No edema.   ADDITIONAL LABORATORY DATA:  From July 19, 2005, white count 5.6, H&H 9.5  and 29.1, MCV 90.3, platelet count 218,000.  Sodium 142, potassium 3.7,  chloride 98, CO2 38, glucose 86, BUN 11, creatinine 0.7.  Calcium 8.9.  She  is occult blood positive.  TSH  normal at 1.166.  Vitamin B12 level 532.   IMPRESSION:  Miss Melanie [name] is a pleasant 77 year old lady with chronic  obstructive pulmonary disease admitted with paroxysmal atrial fibrillation.   She has been found to have acute on chronic anemia and is hemoccult  positive.  Staff reports she has had multiple brown stools.  There has been  no melena or hematochezia.  As stated above, Miss Melanie [name]'s review of systems  is essentially negative.  Findings at colonoscopy two year ago are somewhat  reassuring although the prep was suboptimal.   There was no evidence peptic ulcer disease back in 2005 at EGD.   Her anemia is likely multifactorial.  Iron studies are pending.  We cannot  discount a hemoccult-positive stool, however.   RECOMMENDATIONS:  1.  I think it would be prudent to go ahead and re-look at her upper GI      tract via EGD to rule out mucosal process contributing to the picture of      worsening anemia and hemoccult-positive stool.  Discussed this approach      with Miss Melanie [name].  Will tentatively plan to perform procedure on July 20, 2005.  Potential risks, benefits have been reviewed.  This is      tentative depending on pulmonary stability.  2.  Would follow her H&H and transfuse as needed to keep her H&H around 10      and 30.  3.  Agree with IV proton pump inhibitor therapy empirically.   I would like to thank Dr. Patrica Duel for allowing me to see this nice  lady.      Jonathon Bellows, M.D.  Electronically Signed     RMR/MEDQ  D:  07/19/2005  T:  07/19/2005  Job:  [id number]   cc:   Gerlene Burdock A. Alanda Amass, M.D.  Fax: [phone]

## 2010-08-25 NOTE — Group Therapy Note (Signed)
NAME:  SHANTE, ARCHAMBEAULT                          ACCOUNT NO.:  [id number]   MEDICAL RECORD NO.:  [id number]                   PATIENT TYPE:  INP   LOCATION:  A339                                 FACILITY:  [facility]   PHYSICIAN:  Vickki Hearing, M.D.           DATE OF BIRTH:  1925-09-05   DATE OF PROCEDURE:  DATE OF DISCHARGE:                                   PROGRESS NOTE   SUBJECTIVE/OBJECTIVE:  Left periprosthetic fracture around total hip  prosthesis.   ASSESSMENT/PLAN:  She is stable at this time in terms of the implant.  She  is having some mental status changes, and will be transferred to the second  floor per medicine.  If she is able mentally, she can continue with  progressive gait training with a walker, touch-down weightbearing on the  left leg.  Radiographs will be obtained to check on the condition of the  prosthesis again.  Her potassium has come up to 4.2 from 2.8 after  additional potassium.  MET-7 will be ordered daily per medicine.      ___________________________________________                                            Vickki Hearing, M.D.   SEH/MEDQ  D:  07/16/2003  T:  07/16/2003  Job:  [id number]

## 2010-08-25 NOTE — Procedures (Signed)
   NAME:  Melanie [name], Melanie [name]                          ACCOUNT NO.:  [id number]   MEDICAL RECORD NO.:  [id number]                   PATIENT TYPE:  INP   LOCATION:  IC07                                 FACILITY:  [facility]   PHYSICIAN:  Edward L. Juanetta Gosling, M.D.             DATE OF BIRTH:  May 18, 1925   DATE OF PROCEDURE:  01/29/2003  DATE OF DISCHARGE:                                EKG INTERPRETATION   EKG INTERPRETATION:  Rhythm is sinus rhythm.  There is a somewhat slow R-  wave progression across the precordium.   CONCLUSION:  Borderline electrocardiogram.       ___________________________________________                                            Oneal Deputy. Juanetta Gosling, M.D.   ELH/MEDQ  D:  02/01/2003  T:  02/01/2003  Job:  [id number]

## 2010-08-25 NOTE — Discharge Summary (Signed)
NAME:  Melanie [name], Melanie [name]                          ACCOUNT NO.:  [id number]   MEDICAL RECORD NO.:  [id number]                   PATIENT TYPE:  INP   LOCATION:  A206                                 FACILITY:  [facility]   PHYSICIAN:  Patrica Duel, M.D.                 DATE OF BIRTH:  1925-06-21   DATE OF ADMISSION:  01/25/2003  DATE OF DISCHARGE:                                 DISCHARGE SUMMARY   ADDENDUM:  Please add to discharge summary:  Paroxysmal atrial fibrillation  with a rapid ventricular response, no ischemic or structural cause  determined.   The patient was doing well when I was making rounds the day of the initial  discharge order.  Approximately an hour later, the patient suddenly  developed chest pain and was found to be in atrial fibrillation with a heart  rate of approximately 170 to 180.  Dr. Vida Roller was on the scene, who  attempted cardioversion without success.  She was transferred to the ICU,  where she underwent routine evaluation and initiation of a Cardizem drip.  She did convert overnight.  She was placed on p.o.'s but reverted to atrial  fibrillation that evening.  A  Cardizem drip was again begun, as well as  Lopressor 25 mg b.i.d.   She converted without difficulty overnight.  Lovenox was begun at the onset  of atrial fibrillation.  Coumadinization was discussed, though the patient  flatly refuses this, as she has had complications from this following an  orthopedic procedure in the past.   Theo-Dur was also discontinued and Spiriva one puff daily was added.  She  remained stable for 48 hours and was transferred to 2-A for monitoring.  She  remains in normal sinus rhythm.  Her respiratory status is stable.  She has  residual wheezing, which is chronic, though her O2 saturations are good and  her overall status is much improved and she is requesting discharge.  Currently, I feel this is appropriate.  Her situation is somewhat brittle  but she seems  compliant and had reached maximal hospital benefit from my  determination.   DISPOSITION:  Please scratch previous disposition from the former discharge  summary.  She is to continue her home medications which include:  1. Xanax 2 mg q.8 h.  2. Parafon Forte 1 mg nightly.  3. Fosamax 70 mg q.week.  4. Halcion 0.25 mg nightly.   She will be switched over to:  1. Xopenex nebulizer 0.625 mg.  2. Advair 50/500 mcg.  3. Spiriva one puff daily.  4. Cardizem CD 180 mg.  5. Lopressor 25 mg b.i.d.  6. Prednisone Dosepak 5 mg/12 day.  7. Protonix for prophylactic reasons.   She will also be on aspirin 81 mg daily.   FOLLOWUP:  She will be followed and treated expectantly as an outpatient.     ___________________________________________  Patrica Duel, M.D.   MC/MEDQ  D:  02/03/2003  T:  02/03/2003  Job:  [id number]

## 2010-08-25 NOTE — H&P (Signed)
NAME:  Melanie [name], Melanie [name]                          ACCOUNT NO.:  [id number]   MEDICAL RECORD NO.:  [id number]                   PATIENT TYPE:  INP   LOCATION:  A222                                 FACILITY:  [facility]   PHYSICIAN:  Patrica Duel, M.D.                 DATE OF BIRTH:  02/23/1926   DATE OF ADMISSION:  07/12/2003  DATE OF DISCHARGE:                                HISTORY & PHYSICAL   CHIEF COMPLAINT:  Hip pain.   HISTORY OF PRESENT ILLNESS:  This is a 77 year old female with a history of  severe COPD.  She also has severe osteoarthritis and is status post  bilateral hip arthroplasty.  She reports a history of myocardial infarction  in the remote past.   The patient was doing well today when she lost her footing and fell  striking her left hip. She experienced severe pain in her hip and was  brought to the emergency department via ambulance.  Workup in the emergency  department revealed a fracture of the proximal femur inferior to the  prosthesis.  Dr. Eulah Pont (the patient's orthopedic surgeon performed surgery)  stated that it is not a surgical problem and that she should be admitted  here for pain control and further evaluation and therapy is indicated.  Despite the strong urgings of our ER physician he declined to admit her to  his facility.   There is no history of chest pain, syncope, nausea, vomiting, diarrhea,  melena, hematemesis, hematochezia, or genitourinary symptoms.   The patient was admitted for pain control and further orthopedic evaluation.  She will probably require short-term placement as bed rest may be our only  option for therapy.   CURRENT MEDICATIONS:  1. Cardizem CD 180 mg  2. Lopressor 25 mg  3. Demerol 50 mg p.r.n. severe pain.  4. __________.  5. Xanax p.r.n.   PAST MEDICAL HISTORY:  As noted above.   ALLERGIES:  CODEINE, PENICILLIN, SULFA, MORPHINE SULFATE.  She tolerates  Demerol as noted.   FAMILY HISTORY:  Noncontributory.   SOCIAL  HISTORY:  She is a former smoker.  Currently no alcohol or tobacco  abuse.   PHYSICAL EXAMINATION:  GENERAL:  This is a very pleasant, fully alert,  female who was in no acute distress.  VITAL SIGNS:  On presentation, temperature 97.7, blood pressure 139/70,  pulse 70 and regular, respirations 18.  HEENT:  Normocephalic, atraumatic.  Pupils are equal.  Ears, nose, and  throat benign.  NECK:  Supple.  LUNGS:  Reveal scattered expiratory wheezes with good air exchange (O2  saturation is 96% on room air).  HEART:  Sounds are somewhat distant, no murmurs, rubs, or gallops.  ABDOMEN:  Nontender, nondistended.  Bowel sounds are intact.  EXTREMITIES:  There is no clubbing, cyanosis, or edema. The left hip is very  tender locally and there is very poor range of motion due to pain.  X-ray as noted above.  Lab reveals a white count of 7000, hemoglobin 11.1,  hematocrit 34, coags normal.  MET-7 normal.   ASSESSMENT:  Fracture of the proximal femur medial to prosthesis.   PLAN:  Bed rest, pain control.  We will ask Dr. Romeo Apple to see the patient  in consultation.  Will follow and treat expectantly.     ___________________________________________                                         Patrica Duel, M.D.   MC/MEDQ  D:  07/12/2003  T:  07/12/2003  Job:  [id number]

## 2010-08-25 NOTE — Consult Note (Signed)
NAME:  [name], [name]                          ACCOUNT NO.:  [id number]   MEDICAL RECORD NO.:  [id number]                   PATIENT TYPE:  INP   LOCATION:  A215                                 FACILITY:  [facility]   PHYSICIAN:  Lionel December, M.D.                 DATE OF BIRTH:  1925/10/16   DATE OF CONSULTATION:  07/23/2003  DATE OF DISCHARGE:                                   CONSULTATION   GASTROENTEROLOGY CONSULTATION:   REASON FOR CONSULTATION:  Nausea and vomiting of recent onset.   HISTORY OF PRESENT ILLNESS:  Melanie [name] is a 77 year old Caucasian female who  was just discharged from this facility yesterday after having been in the  hospital for about 10 days.  She was admitted with a fall resulting in left  hip fracture which around the prosthesis.  She improved with bed rest and  conservative therapy.  During that hospitalization she did have drop in her  H&H.  Her iron studies suggested iron deficiency.  She had EGD and  colonoscopy by Dr. Jena Gauss on July 21, 2003.  She had noncritical Schatzki's  ring, moderate-size sliding hiatal hernia, normal examination of stomach,  first and second part of the duodenum.  She had two polyps removed from her  colon.  Both of these are benign; I believe one is an adenoma.  She also had  left colonic diverticulosis.  Patient was given 2 units of PRBCs.  She went  to [facility] for rehab.  While in the hospital, she did not have any vomiting.  She did have nausea early on which was felt to be related to her Fosamax.   Patient states that she has been nauseated ever since she left this  facility.  She has heaved, she has had maybe scant amount of liquid or  water, she states there is nothing in her stomach to come out.  She denies  abdominal pain, hematemesis, fever or chills, headache.  She was evaluated  in the emergency room.  She had an ultrasound which showed cholelithiasis,  CBD measured 7 mm which is upper limit of normal and pancreatic duct  was  also noted to be prominent but there was no evidence of thickening of  gallbladder wall or pericholecystic fluid.  Patient denies chest pain or  dyspnea.  She complains of left hip pain but she states is it much better  than it was about 10 days ago.  She has not noted any swelling to her lower  extremities.   USUAL MEDICATIONS:  1. Xanax 2 mg t.i.d.  2. Xopenex neb q.i.d.  3. Protonix 40 mg every morning.  4. Duragesic - 50 once daily which was just started either a day or two     prior to admission.  5. Peri-Colace b.i.d.  6. Parafon Forte one q.i.d.  7. K-Dur 20 mEq once daily.   She is presently on Parafon  Forte, Peri-Colace, Xanax, and Xopenex as well  as albuterol two puffs q.6h.   PAST MEDICAL HISTORY:  She has coronary artery disease, COPD, osteoporosis,  and osteoarthrosis.  She has had history of anemia diagnosed during last  hospitalization, possibly iron deficiency, workup negative for peptic ulcer  disease or colonic mass, she had two small polyps snared, she has colonic  diverticulosis.  She recently suffered periprosthesis left hip fracture.  She also has had surgery on her other hip, surgery on both feet, elbows, and  shoulder and she has had appendectomy as well as BSO with hysterectomy.   ALLERGIES:  IRON.   FAMILY HISTORY:  Noncontributory.   SOCIAL HISTORY:  She is retired from [employer].  She has been smoking for several  years.  She has one daughter.   PHYSICAL EXAMINATION:  GENERAL:  Pleasant well-developed thin Caucasian  female who is in no acute distress.  VITAL SIGNS:  She weighs 190 pounds.  She is 6 feet 3 inches tall.  Pulse  106 per minute.  Blood pressure 141/94, respirations 20, and temperature is  100.  HEENT:  Conjunctivae are pink.  Sclerae are nonicteric.  Oral mucosa is  normal.  NECK:  No neck masses are noted.  CHEST:  AP diameter is increased.  She has a few fine rhonchi at both bases.  No rales noted.  CARDIAC:  Regular rhythm,  normal S1 and S3.  No murmur or gallop noted.  ABDOMEN:  Flat, bowel sounds are normal, it is soft and nontender without  organomegaly or masses.  RECTAL:  Examination deferred.  EXTREMITIES:  She does not have clubbing or peripheral edema.  She also does  not have any edema to her calves or thighs.   LABORATORIES:  WBC is 8.3, H&H are 14.5 and 43.3, platelet count is 345K,  MCV 81.7.  Her H&H yesterday were 13.1 and 40.  Her INR is 0.9, PTT is 28,  sodium 135, potassium 3.8, chloride 92, CO2 is 31, glucose 111, BUN 7,  creatinine 0.5, total bilirubin is 1.1, indirect 0.9, AP 101, AST is  __________ , ALT 23, total protein 6.2 with albumin of 3.6 and calcium is  9.2.   Ultrasound of her upper abdomen shows cholelithiasis without thickened GB  wall, borderline size of bile duct at 7 mm and a prominent pancreatic duct.   ASSESSMENT:  Melanie [name] is 77 year old Caucasian female with multiple medical  problems who was just discharged from this facility yesterday after having  been treated for periprosthesis left hip fracture.  She developed anemia  during that hospitalization felt to be due to blood loss at fracture site.  She did have EGD and a colonoscopy with findings as above.  She does have a  moderate-size sliding hiatal hernia however, did not have peptic ulcer  disease.  She now presents with nausea, vomiting, and heaving.  Ultrasound  reveals cholelithiasis with borderline common bile duct and pancreatic duct  however, she does not have abdominal or back pain and abdominal exam is  completely benign.  Her WBC is normal as are the transaminases.   Therefore, I am not convinced that her symptoms are due to cholelithiasis.  It is possible that her acute symptomatology is due to Duragesic which was  just started yesterday.  I doubt that this is the result of gastroesophageal  reflux disease.   RECOMMENDATIONS: 1. Protonix will be resumed; would give her 40 mg IV once daily.  2. LFTs  will be  repeated in the morning along with amylase and lipase.  3. We will proceed with hepatobiliary scan.  If cystic duct is obstructed     she will need cholecystectomy.   I would like to thank Dr. Phillips Odor for the opportunity to participate in the  care of this nice lady.      ___________________________________________                                            Lionel December, M.D.   NR/MEDQ  D:  07/23/2003  T:  07/24/2003  Job:  [id number]

## 2010-08-25 NOTE — H&P (Signed)
NAME:  Melanie [name], Melanie [name]                ACCOUNT NO.:  [id number]   MEDICAL RECORD NO.:  [id number]           PATIENT TYPE:  INP   LOCATION:  A217                          FACILITY:  [facility]   PHYSICIAN:  Richard A. Alanda Amass, M.D.DATE OF BIRTH:  March 25, 1926   DATE OF ADMISSION:  DATE OF DISCHARGE:  LH                                HISTORY & PHYSICAL   HISTORY. OF PRESENT ILLNESS:  Melanie [name] is a 77 year old widowed mother of  one daughter.  She has no grandchildren.  She is a patient of Dr. Nobie Putnam  and is referred for evaluation of increasing shortness of breath, weakness,  fatigue, intermittent confusion, instability of gait and palpitations.  She  was seen earlier today at Dr. Geanie Logan office and was having frequent  atrial extrasystoles and irregular rhythm.  She was referred for further  cardiac evaluation.  At our office during evaluation she was found to be in  atrial fibrillation with rapid ventricular response.  She does have home  oxygen because of COPD and continued cigarette abuse and she was using that.  She is aware of palpitations and she has had some episodic chest pressure  associated with this although this is somewhat vague.  She is not having any  chest pressure at present.   She has lived alone and driven a car by herself up until approximately 1  month ago.  Since that time she has had increasing shortness of breath,  exercise intolerance, weakness and fatigue.  Her daughter moved her into her  house one week ago because of these symptoms.  She has noted that she has  had intermittent confusion and unsteady gait.  The daughter does not recall  her complaining of chest pain.   She is a very independent woman who has declined diagnostic tests and  medications in the past but has done well clinically up until now.   She was seen by me in April 2002 for evaluation of chest pain.  It was noted  that she had a prior evaluation in 2001 by Dr. Shelva Majestic for chest pain and  apparently an exercise echo was normal with normal LV function and no  ischemia.  I recommend she have a Persantine and or dobutamine Cardiolite  and the patient agreed to this but she later declined and cancelled her  appointment and did not come back for any followup visits.  She is noted to  have symptoms of reflux, musculoskeletal discomfort and osteoarthritis of  the fingers.  Laboratories showed a cholesterol of 222, LDL 138 (April  2002), HDL 53, triglycerides of 154.  She had normal CBC and diff.  It was  recommended she start a statin but the patient declined to do this as well.   Her only medications are Xanax 1 mg t.i.d. to q.i.d. and O2 3 to 4 liters at  home which she has been using continuously for the last several days to a  week because of her decline in clinical condition.  Her daughter also feels  that she has had swelling of her feet as well.  The  daughter is quite  concerned about her status.   PAST MEDICAL HISTORY:  Shows remote bilateral hip replacements by Dr. Eulah Pont  in the past, 10 years ago and the second in 2000.  During one of those  hospitalizations, possibly her left hip, she was started on Coumadin and she  apparently developed GI bleeding with a gastric ulcer and I almost died.  So much to say that she initially refused any consideration for Coumadin  therapy when I discussed that with her on admission of this occasion.   She has had intermittent Atrovent and albuterol treatments for asthma but  none recently.  There is no hypertension, no history of diabetes.  She does  have a chronic cough and she has not had any recent fever or chills to  suggest acute bronchitis or pneumonia.  She has not had any dysuria.  There  has been no syncope or presyncope.   PHYSICAL EXAMINATION:  GENERAL:  She is elderly, frail appearing lady who  appears older than her stated age of 77.  She is using some accessory  muscles of respirations.  VITAL SIGNS:  Height 5 feet 2  inches, weight 117.  Blood pressure 145-148/80-85 bilaterally.  NECK:  There are soft bilateral  carotid bruits with good upstrokes.  Thyroid is not enlarged.  LUNGS:  She  has diminished breath sounds significantly bilaterally but no wheezes, rubs  or rhonchi at present.  HEART:  PMI is inside the MCL in the 6/CS.  Diminished S1 but it is split, somewhat increased with S2.  Rate 160 per  minute, slightly irregular.  There is a 2/6 systolic murmur.  No diastolic  murmur or rubs.  ABDOMEN:  Scaphoid.  Abdominal aorta is palpable.  There is  no significant bruits.  The abdomen is non-tender.  Liver, spleen and kidney  are not palpable.  Femorals intact +2.  DP and PT are diminished but  palpable.  There are faint pedal edema but no tibial edema.  NEUROLOGIC:  There are no pathologic reflexes.  She is oriented x3.   REVIEW OF STUDIES:  EKG shows atrial fibrillation with rapid ventricular  response, secondary to ST T wave abnormalities.  There is poor R wave  percussion V1-V3 with no diagnostic Q waves.  It is difficult to say whether  this is fibrillation or flutter but no flutter waves are seen.  With carotid  sinus pressure she breaks the sinus rhythm and then starts back into atrial  fibrillation within 15 to 30 seconds at a fairly rapid rate.   This patient has atrial fibrillation with rapid ventricular response.  She  has probably had it off and on paroxysmally for several weeks and it may be  accounting for her symptoms of weakness, fatigue, shortness of breath,  intermittent confusion.  She has not had any overt heart failure symptoms.  She was a smoker for 30 to 40 years but quit in the year 2000.   RECOMMENDATIONS:  It is recommended that she be admitted to the hospital for  medical therapy predominantly present with rate control and anticoagulation.  She is adverse to any chronic Coumadin therapy but she may be willing to consider that.  I did explain to the daughter and the  mother that her major  risks besides the deterioration of her cardiopulmonary function would be  systemic embolization and stroke.  They are agreeable to going to the  hospital for heparin therapy.  She may not be agreeable to Coumadin  therapy.  Because of her COPD and need for chronic oxygen I would put her on calcium  blockers for rate control and Lanoxin.  We will continue Xanax.  I will add  empiric PPI.   It is difficult to tell whether her intermittent chest pressure is related  to coronary disease but as I explained to the daughter she is at high risk  for having coronary disease and we will get laboratory biomarker evaluation  and followup her EKGs.  If she remains clinically stable without any  significant enzyme problems to suggest ischemia then we may want to follow  up with an outpatient Cardiolite.  I would recommend also a 2D echo for  evaluation.   ADMITTING DIAGNOSIS:  1.  Symptomatic atrial fibrillation with rapid ventricular response,      probably paroxysmal over the last several weeks.  2.  Chronic obstructive pulmonary disease.  Discontinued smoking 2000, home      O2.  Recently used continuously.  3.  Remote history of apparent gastric ulcer on Coumadin status post hip      replacement.  4.  Status post remote bilateral hip replacement, Dr. Eulah Pont, last one in      2000 and prior to that approximately 12 years ago.  5.  Past history of possible gastric ulcer and gastrointestinal bleeding on      Coumadin therapy, remote, no recurrence.  6.  History of gastroesophageal reflux disease.  7.  Hyperlipidemia with recommendation for statin therapy.   PLAN:  As outlined above.      Richard A. Alanda Amass, M.D.  Electronically Signed     RAW/MEDQ  D:  07/17/2005  T:  07/17/2005  Job:  [id number]   cc:   Patrica Duel, M.D.  Fax: [phone]

## 2010-08-25 NOTE — Op Note (Signed)
NAME:  Melanie [name], Melanie [name]                ACCOUNT NO.:  [id number]   MEDICAL RECORD NO.:  [id number]          PATIENT TYPE:  AMB   LOCATION:  DAY                           FACILITY:  [facility]   PHYSICIAN:  Vickki Hearing, M.D.DATE OF BIRTH:  1925-07-01   DATE OF PROCEDURE:  02/18/2004  DATE OF DISCHARGE:                                 OPERATIVE REPORT   PREOPERATIVE DIAGNOSIS:  Volar Barton's fracture of the left distal radius.   POSTOPERATIVE DIAGNOSIS:  Volar Barton's fracture of the left distal radius.   PROCEDURE:  Open treatment, internal fixation with a Leibinger buttress  plate.   SURGEON:  Vickki Hearing, M.D.   ANESTHESIA:  Axillary block.   TOURNIQUET TIME:  31 minutes.   ESTIMATED BLOOD LOSS:  None.   COMPLICATIONS:  None.   SPECIMENS:  None.   HISTORY:  A 77 year old female fell and fractured her left wrist.  Presented  with a displaced, comminuted fracture.  She was placed in a splint in the  office and scheduled for surgery.   The patient was seen in the preop holding area.  The surgical site was  confirmed by her marking the area.  I followed this up with my initials over  the left wrist area.  She was given Clindamycin due to PENICILLIN allergy  and taken to surgery.  Dr. Jayme Cloud had placed an axillary block, and she  was placed supine on the operating table.  Her left upper extremity was  prepped and draped using sterile technique.   A time out was taken as required, and the surgical site was confirmed.   The wrist was x-rayed, AP and lateral views.  Closed reduction was  performed.  A skin tear was noted in the proximal forearm due to traction.  The patient's left upper extremity was exsanguinated with a four-inch  Esmarch.  A volar Sherilyn Cooter approach was performed.  Radial artery and flexor  carpi radialis tendon were preserved.  The pronator quadratus muscle was  subperiosteally dissected from the distal radius.  A volar buttress plate  was applied  with two  screws.  Radiographs in AP and lateral position confirmed the fracture  reduction.  The wound was irrigated, closed with 2-0 Vicryl and staples.  It  was injected with 10 mL of Marcaine.  A volar splint was applied.  The  tourniquet was released.  The patient was taken to the recovery room in  stable condition.     Weyman Croon   SEH/MEDQ  D:  02/18/2004  T:  02/18/2004  Job:  [id number]

## 2010-08-25 NOTE — Discharge Summary (Signed)
Melanie [name], Melanie [name]                ACCOUNT NO.:  [id number]   MEDICAL RECORD NO.:  [id number]          PATIENT TYPE:  INP   LOCATION:  A217                          FACILITY:  [facility]   PHYSICIAN:  Patrica Duel, M.D.    DATE OF BIRTH:  [birth year]   DATE OF ADMISSION:  07/17/2005  DATE OF DISCHARGE:  04/21/2007LH                                 DISCHARGE SUMMARY   DISCHARGE DIAGNOSES:  1.  Atrial fibrillation.  2.  Severe chronic obstructive pulmonary disease.  3.  Ongoing tobacco abuse.  4.  Severe osteoarthritis, status post bilateral hip arthroplasties as well      as periprosthetic fracture of the proximal femur.  5.  Longstanding mild anemia, status post esophagogastroduodenoscopy and      total colonoscopy with polypectomy, February 2005.  6.  Coumadin intolerance.  7.  History of a gastric ulcer, remote past.  8.  Mild hyperlipidemia.  No statin therapy at this time.   For details regarding admission, please refer to the admitting note.  Briefly, this 77 year old female with the above history presented to the  office the day of admission with weakness, intermittent tachy palpitations  and shortness of breath.  She had also had some chest wall pain.  She was  brought to the office and was found to be mildly hypoxic with what appeared  to be a mild exacerbation of COPD.  An EKG was obtained which revealed  normal sinus rhythm with P-pulmonale and frequent PACs.  She was sent over  to [facility] for evaluation.  She was found to be tachycardic  with a heart rate of approximately 160 (atrial fibrillation).  The patient  was admitted with exacerbation of COPD and paroxysmal atrial fibrillation.   HOSPITAL COURSE:  The patient was put on 2-A telly where she was treated  routinely.  She was treated with a Cardizem drip, potassium supplementation  and pulmonary toilet.  GI was consulted due to mild anemia.  Colonoscopy and  EGD were declined.  She preferred outpatient  colonoscopy.  The patient did  well and eventually was stable for discharge.  Also, incidentally found was  an abdominal aortic aneurysm, which cardiology feels can be followed as an  outpatient.   DISPOSITION:  Medications include:  1.  Aspirin 81 mg daily.  2.  Zocor 20 mg daily.  3.  AcipHex 20 mg daily.  4.  Repliva 1 tab daily.  5.  Digoxin 0.5 mg daily.  6.  Cardizem CD 300 mg daily.  7.  Plavix 75 mg daily.  8.  Xopenex nebs.   She will be followed and treated expectantly as an outpatient.      Patrica Duel, M.D.  Electronically Signed     MC/MEDQ  D:  10/07/2005  T:  10/08/2005  Job:  [id number]

## 2010-08-25 NOTE — Consult Note (Signed)
NAME:  Melanie [name]                          ACCOUNT NO.:  [id number]   MEDICAL RECORD NO.:  [id number]                   PATIENT TYPE:  INP   LOCATION:  IC07                                 FACILITY:  [facility]   PHYSICIAN:  Edward L. Juanetta Gosling, M.D.             DATE OF BIRTH:  1925/05/26   DATE OF CONSULTATION:  02/01/2003  DATE OF DISCHARGE:                                   CONSULTATION   Patient of Dr. Nobie Putnam.   HISTORY:  Melanie [name] is a 77 year old who has known severe COPD.  She had  come to the emergency room with about a 1-week history of increasing  shortness of breath, cough, wheeze and just not feeling well.  When she was  seen in the emergency room she was noted to have pulse oximetry of 75%.  She  was treated with nebulizers, etc; but was not able to be discharged home and  was admitted to the hospital.  Since her hospitalization she has had atrial  fibrillation with rapid ventricular response and ended up having to have  multiple treatments for this but now is in sinus rhythm.  She remains in the  intensive care unit, remains in sinus rhythm, and says that she is feeling  better.   PAST MEDICAL HISTORY:  Her past medical history is positive mostly for her  COPD.  She also has a history of anxiety, and history of osteoporosis.   MEDICATIONS AT HOME:  1. Xanax 2 mg q.8h.  2. Parafon Forte 1 q.h.s.  3. Fosamax 70 mg weekly.  4. Halcion 0.25 mg q.h.s.  5. DuoNeb which she uses in her nebulizer at home.   SOCIAL HISTORY:  She has an 80-pack-year smoking history; she stopped about  a year ago. She does not drink any alcohol.   FAMILY HISTORY:  Positive for COPD, she says.   PHYSICAL EXAMINATION:  GENERAL:  Physical exam shows a well-developed, well-  nourished, thin female who is in no acute distress now.  VITAL SIGNS:  Her temperature is 97.1; pulse 81; respirations 20; blood  pressure 129/71.  Blood sugar 131.  CHEST:  Her chest is clear, but on forced expiration  she has some wheezes.   LABORATORY DATA:  Chest x-ray done yesterday shows continued problems with  the COPD, but no acute changes.  Her most recent lab work shows hemoglobin  is 10, white count 8000.  Potassium was listed at 2.5, but was replaced up  to 3.6 at last check.    ASSESSMENT:  She has severe chronic obstructive pulmonary disease with  resulting atrial fibrillation.  I would agree with plans to continue with  her treatments, try to get her up and moving around a little bit, continue  with nebulizer treatments and follow.      ___________________________________________  Edward L. Juanetta Gosling, M.D.   ELH/MEDQ  D:  02/01/2003  T:  02/01/2003  Job:  [id number]

## 2010-08-25 NOTE — H&P (Signed)
[facility]  Patient:    Melanie [name], KERPER Visit Number: [id number] MRN: [id number]          Service Type: MED Location: 3A [id number] Attending Physician:  Hilario Quarry Dictated by:   Leonia Reeves, M.D. Admit Date:  09/23/2001                           History and Physical  CHIEF COMPLAINT:  Shortness of breath, cough, difficulty breathing.  HISTORY OF PRESENT ILLNESS:  The patient is a 77 year old white female, with a medical history of advanced degenerative joint disease, tobacco abuse, who presents in the ER with complaint of shortness of breath, difficult breathing, and cough productive of whitish/yellow sputum, all of which started three days ago and has progressively gotten worse.  The patient also stated that she had low-grade fever.  The patient was apparently well and at her baseline health status prior to about three days ago when she noted cough, shortness of breath, and difficulty breathing.  She denied nausea, vomiting, abdominal pain, and diarrhea.  She also denied chest pain, palpitations, and leg swelling.  PAST MEDICAL HISTORY:  1. Advanced degenerative joint disease, for which she had right total hip     replacement in June 2001.  2. Tobacco abuse.  The patient denied history of diabetes mellitus, hypertension, asthma, COPD, coronary artery disease, hyperlipidemia, and also denied history of stroke.  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  She has been smoking cigarettes all her adult life.  Denied alcohol and any other drug abuse.  ALLERGIES:  1. PENICILLIN.  2. SULFA.  3. CODEINE.  CURRENT MEDICATIONS:  1. Xanax p.r.n.  2. "Pain medication."  REVIEW OF SYSTEMS:  Shortness of breath, cough, and dyspnea.  Denied chest pain, nausea, vomiting, and abdominal pain.  PHYSICAL EXAMINATION:  GENERAL:  Elderly, pleasant woman in moderate respiratory distress.  The patient is  alert and oriented x3.  VITAL SIGNS:  Blood pressure 114/78, pulse 113, respiratory rate 24, temperature 98.5 degrees.  Height 5 feet 1 inch.  Weight 100 pounds.  Oxygen saturation 95%.  HEENT:  Head normocephalic, atraumatic.  PERRLA.  Mucous membranes moist.  No oropharyngeal lesion.  NECK:  Supple without adenopathy.  No jugular venous distention.  No carotid bruits.  CARDIOVASCULAR:  S1 and S2 are normal.  No S3 gallop, no S4, no murmur appreciated.  RESPIRATORY:  Lungs positive for wheezing, mainly expiratory, and also a few scattered rhonchi.  There is fair air entry bilaterally.  ABDOMEN:  Soft.  No area of tenderness.  No palpable mass.  No organomegaly. Bowel sounds normoactive.  EXTREMITIES:  No edema.  No cyanosis.  No digital clubbing.  Pulses are palpable and normal.  NEUROLOGIC:  The patient is anxious.  No focal neurological deficits.  Cranial nerves II-XII are grossly intact.  SKIN:  Warm.  No skin rash.  No skin breakdown.  DIAGNOSTIC INVESTIGATIONS:  Chest x-ray, unofficial report, few minimal infiltrates, mainly at the right lower lobe, and questionable calcification of the right lobe.  ABG on 2 liters - pH 7.35, pCO2 51.2, pO2 102, bicarbonate 27.6, saturation 97.9.  EKG - sinus tachycardia, heart rate of 112; no evidence of acute ischemia.  Comprehensive metabolic panel - sodium 146, potassium 4.9, chloride 106, carbon dioxide 32, glucose 109, BUN 8, creatinine 0.7, calcium 9.1, total protein 6.6, albumin 3.7, AST 24, ALT 16, alkaline  phosphatase 77, total bilirubin 0.4.  CBC - WBC 7.7, hemoglobin 11.9, hematocrit 36.3; platelets 284,000.  ASSESSMENT:  Elderly white woman with a history of tobacco abuse all her adult life, but no defined history of chronic obstructive pulmonary disease, now presenting with difficulty breathing, shortness of breath, cough, and wheezing, arterial blood gas showing hypoxia, electrocardiogram sinus tachycardia with no  evidence of acute ischemia, chest x-ray with questionable few calcifications on the right lobe, CBC within normal limits as well as comprehensive metabolic panel, vital signs within normal limits except tachycardia of 113 and tachypnea of 24.  Lung examination shows mainly expiratory wheezes, with few scattered rhonchi.  The patient will be admitted to the medical floor with the following working diagnoses:  1. Questionable chronic obstructive pulmonary disease.  The patient has been     started on breathing treatment in emergency.  She has received intravenous     Solu-Medrol, albuterol and Atrovent.  This will be continued on the     medical Floor.  The patient has also been given intravenous Levaquin in     the emergency room.  As part of the management of chronic obstructive     pulmonary disease she will continue with intravenous antibiotics     (Levaquin).  2. Chest x-ray shows what looked like multiple nodules and calcification.  A     CT scan is indicated for further evaluation.  3. Tobacco abuse.  The patient has been counseled regarding tobacco     cessation.  She is aware that continued tobacco use will worsen her     medical problem.  The patient will be placed on a nicotine patch.  4. The patients current medications will be added as needed.  5. Further work-up will include a cardiac panel.  Dictated by:   Leonia Reeves, M.D. Attending Physician:  Hilario Quarry DD:  09/23/01 TD:  09/25/01 Job: [id number]

## 2010-08-25 NOTE — H&P (Signed)
NAME:  Melanie [name], Melanie [name]                ACCOUNT NO.:  [id number]   MEDICAL RECORD NO.:  [id number]          PATIENT TYPE:  INP   LOCATION:  A217                          FACILITY:  [facility]   PHYSICIAN:  Patrica Duel, M.D.    DATE OF BIRTH:  [birth year]   DATE OF ADMISSION:  07/15/2005  DATE OF DISCHARGE:  LH                                HISTORY & PHYSICAL   CHIEF COMPLAINT:  Shortness of breath.   HISTORY OF PRESENT ILLNESS:  This is a 77 year old female with history of  severe COPD. She has been maintained on minimal medications by her choosing  and continues to smoke but has been remarkably stable. She also has severe  osteoarthritis and status post bilateral hip arthroplasties. She suffered a  periprosthetic fracture of the proximal femur in April 2005. She has long  standing mild anemia of questionable etiology. She has undergone  esophagogastroduodenoscopy and colonoscopy with polypectomy in February 2005  and no obvious source of bleeding determined. She has demonstrated Coumadin  intolerance in the past. She is also steroid intolerant by history.   The patient has been doing reasonably well and independent until the last  several weeks when she became increasingly dyspneic and weak. She has not  had any true chest pain except for chest wall pain. She was brought to the  hospital for evaluation of the above complaints. She was found to be mildly  hypoxic with what appeared to be exacerbation of COPD. She was wheezing  diffusely but was alert in no acute distress. Upon obtaining a history of  chest pain which was somewhat atypical, an EKG was obtained which revealed  cor pulmonale but no acute changes. She did have a normal sinus rhythm with  frequent PACs noted.   The patient was sent to [facility] office for evaluation. Upon  arrival, the patient was noted to be tachycardic with a heart rate of  approximately 160. Dr. Alanda Amass obtained an EKG which revealed atrial  fibrillation.   The patient was admitted with exacerbation of COPD and paroxysmal atrial  fibrillation versus multifocal atrial tachycardia.   There is no history of headache, neurologic deficits, nausea, vomiting,  diarrhea, melena, hematemesis, hematochezia, or genitourinary symptoms.   MEDICATIONS ON ADMISSION:  1.  Xanax 2 mg p.o. t.i.d. p.r.n.  2.  Home O2 p.r.n.  3.  Albuterol p.r.n.   ALLERGIES:  As noted above.   PAST MEDICAL HISTORY:  As noted.   REVIEW OF SYSTEMS:  Negative except as mentioned.   FAMILY HISTORY:  Noncontributory.   PHYSICAL EXAMINATION:  GENERAL:  This is a very pleasant alert female who is  in no acute distress.  VITAL SIGNS:  In the office BP was 110/70. Currently on Cardizem drip,  systolic BP is 95/40, heart rate is 73 with what appears to be normal sinus  and frequent PACs.  HEENT:  Normocephalic, atraumatic. Pupils are equal. Ears, nose, throat  benign.  NECK:  Supple.  LUNGS:  Distant. Diffuse fine expiratory wheezes are noted.  HEART:  Sounds are normal except for an occasional ectopic.  ABDOMEN:  Nontender, nondistended.  EXTREMITIES:  No cyanosis, clubbing or edema.  NEUROLOGIC:  Normal.   PERTINENT LABORATORY DATA:  ABG on room air revealed a pH of 7.4, pCO2 of 63  and PO2 of 43 and an O2 sat of 80%. Currently she is satting 100% on 2  liters. CBC, white count 7700 with normal differential. H&H 11.2 and 35. MET  7 normal except for a potassium of 3.4, chloride 94, pCO2 of 46. TFTs are  normal, B12 is also normal.   Chest x-ray is pending.   ASSESSMENT:  Paroxysmal atrial fibrillation in 77 year old female with  advanced chronic obstructive pulmonary disease and high risk for atrial  arrhythmias.   PLAN:  [facility] will continue to follow the patient. She has  been begun on p.o. Cardizem as well as a drip and Lanoxin for rate control.  Will institute pulmonary toilet and treat expectantly.      Patrica Duel,  M.D.  Electronically Signed     MC/MEDQ  D:  07/18/2005  T:  07/18/2005  Job:  [id number]

## 2010-08-25 NOTE — Op Note (Signed)
NAME:  [name], Melanie Elbert Ewings                         ACCOUNT NO.:  [id number]   MEDICAL RECORD NO.:  [id number]                  PATIENT TYPE:   LOCATION:                                       FACILITY:   PHYSICIAN:  R. Roetta Sessions, M.D.              DATE OF BIRTH:   DATE OF PROCEDURE:  07/21/2003  DATE OF DISCHARGE:                                 OPERATIVE REPORT   PROCEDURE:  Diagnostic esophagogastroduodenoscopy followed by colonoscopy  and snare polypectomy.   ENDOSCOPIST:  Gerrit Friends. Rourk, M.D.   INDICATIONS FOR PROCEDURE:  The patient is a 77 year old lady admitted to  the hospital with a hip fracture around her previous prosthesis. She had a  significant drop in her hemoglobin.  No melena or rectal bleeding, although  she has had some recent nausea with Fosamax which has improved.  She is  taking Protonix.  She has a mixed picture of iron-deficiency of chronic  disease on her labs.  She has never had a colonoscopy.  EGD and colonoscopy  are now being done.  This approach has been discussed at length with Drs.  Cresenzo and [place] as well as with Ms. Ambika Zettlemoyer, this patient's  daughter.  The potential risks, benefits, and alternatives have been  reviewed with the daughter and the patient.  All parties are agreeable.   PROCEDURE NOTE:  O2 saturation, blood pressure, pulse and respirations were  monitored throughout the entire procedure.  Conscious sedation: Versed 2 mg  IV, Demerol 50 mg IV in divided doses.  Vancomycin 1 gm IV and gentamicin 75  mg IV for prophylaxis with recent hip fracture.   INSTRUMENT:  Olympus GIF-140 gastroscope and pediatric colonoscope.   EGD FINDINGS:  Examination of the tubular esophagus revealed no mucosal  abnormalities aside from a widely patent noncritical Schatzki's ring.  EG  junction easily traversed.   STOMACH:  The gastric cavity was empty.  It insufflated well with air.  A  thorough examination of the gastric mucosa including a  retroflex view of the  proximal stomach and esophagogastric junction demonstrated only a moderate  sized hiatal hernia.  The pylorus was patent and easily traversed.   DUODENUM:  The bulb and the second portion appeared normal.   THERAPEUTIC/DIAGNOSTIC MANEUVERS:  None.   The patient tolerated the procedure well and was prepared for colonoscopy.  A digital rectal exam revealed no abnormalities.   ENDOSCOPIC FINDINGS:  The prep was marginal with viscus liquid stool  throughout the colon which was fairly easily washed and suctioned.   RECTUM:  Examination of the rectal mucosa including the retroflex view of  the anal verge revealed only normal mucosa.   COLON:  The colonic mucosa was surveyed from the rectosigmoid junction  through the left transverse and right colon to the area of the appendiceal  orifice, ileocecal valve, and cecum.  These structures were well seen and  photographed for the  record.   From this level the scope was slowly withdrawn; and all previously mentioned  mucosal surfaces were again seen.  The following abnormalities were noted:  There was a 0.75 cm polyp at the hepatic flexure, a 0.5 cm polyp at the  splenic flexure; both were cold snared and recovered.  The patient had  densely populated left-sided diverticula.  The remainder of the colonic  mucosa appeared normal.  The patient tolerated both procedures well and was  reacted in endoscopy.   EGD IMPRESSION:  1. Noncritical Schatzki's ring otherwise normal esophagus.  2. Moderate sized hiatal hernia otherwise normal stomach.  3. Normal D1 and D2.   COLONOSCOPY FINDINGS:  1. Normal rectum.  2. Left-sided diverticula and polyps, removed with cold snare; technique as     described above.  3. The remainder of the colonic mucosa appeared normal.   DISCUSSION:  I suspect that the patient's anemia is more chronic disease or  in part blood loss from recent hip fracture.   RECOMMENDATIONS:  1. Advance diet as  tolerated.  2. Continue Protonix.  3. May continue Fosamax with the usual swallowing precautions.  4. Follow up on polyp path.  5. Could go to rehab anytime from a GI perspective.      ___________________________________________                                            Jonathon Bellows, M.D.   RMR/MEDQ  D:  07/21/2003  T:  07/21/2003  Job:  [id number]   cc:   Patrica Duel, M.D.  [address], [address]  [place]  [place] [address]  Fax: [phone]   R. Roetta Sessions, M.D.  [address]  [place]  [place] [address]  Fax: [phone]

## 2010-08-25 NOTE — Procedures (Signed)
NAME:  Melanie [name], Melanie [name]                ACCOUNT NO.:  [id number]   MEDICAL RECORD NO.:  [id number]          PATIENT TYPE:  INP   LOCATION:  A217                          FACILITY:  [facility]   PHYSICIAN:  Edward L. Juanetta Gosling, M.D.DATE OF BIRTH:  [birth year]   DATE OF PROCEDURE:  07/17/2005  DATE OF DISCHARGE:                                EKG INTERPRETATION   TIME:  1730.   DATE:  July 17, 2005.   There is a great deal of artifact in this tracing.  The rhythm appears to be  a sinus tachycardia.  The computer has read it second degree Mobitz AV  block.  I cannot definitely tell that from this tracing.  Q-waves are seen  in V1 and V2, which may indicate a previous septal infarction.  Abnormal  electrocardiogram.      Oneal Deputy. Juanetta Gosling, M.D.  Electronically Signed     ELH/MEDQ  D:  07/18/2005  T:  07/18/2005  Job:  [id number]

## 2010-08-25 NOTE — Op Note (Signed)
NAMEDURENDA, PECHACEK                ACCOUNT NO.:  [id number]   MEDICAL RECORD NO.:  [id number]          PATIENT TYPE:  OIB   LOCATION:  5037                         FACILITY:  [facility]   PHYSICIAN:  Cindee Salt, M.D.       DATE OF BIRTH:  22-Mar-1926   DATE OF PROCEDURE:  03/31/2004  DATE OF DISCHARGE:  04/01/2004                                 OPERATIVE REPORT   PREOPERATIVE DIAGNOSIS:  Fracture, right radius, nascent malunion, left  radius, status post internal fixation.   POSTOPERATIVE DIAGNOSIS:  Fracture, right radius, nascent malunion, left  radius, status post internal fixation.   OPERATION PERFORMED:  Open reduction internal fixation, right distal radius  fracture, removal of Stryker plate with reduction of nascent malunion and  placement of a volar DVR plate, left wrist.   SURGEON:  Cindee Salt, M.D.   ASSISTANT:  Artist Pais. Mina Marble, M.D.   ANESTHESIA:  General.   INDICATIONS FOR PROCEDURE:  The patient is a 77 year old female who suffered  a fall approximately three to four weeks ago.  She underwent open reduction  internal fixation with a Stryker plate.  She has had displacement of the  fracture fragments and is admitted now for reduction and fixation with  removal of the plate. She last week suffered a fracture of her right distal  radius, comminuted intra-articular in nature.  She is admitted for open  reduction internal fixation of that wrist also.   DESCRIPTION OF PROCEDURE:  The patient was brought to the operating room  where general anesthetic was carried out without difficulty.  She was  prepped using DuraPrep, right arm free.  This was done in a supine position.  The limb was exsanguinated with an Esmarch bandage.  Tourniquet placed high  on the arm was inflated to 250 mm.  A volar radial incision was made,  carried down through subcutaneous tissue, dissection carried down radial to  the flexor carpi radialis. The radial artery was protected, the dissection  carried down to the pronator quadratus.  This was elevated off.  The  fracture was immediately apparent.  This was manipulated, reduced, checked  under image intensification in both AP and lateral direction.  A right volar  standard DVR plate was then placed.  This was pinned in position with 45 K-  wires.  X-rays confirmed adequate positioning of the fracture fragments and  plate.  The variable screw was then placed.  This measured 14 mm.  This  fixed the plate proximally.  The distal margin was then placed.  Pins  inserted along with half screws.  These were done with half screws on the  radial side measuring 22 to 20 mm, a combination of pins and terminal screws  were placed distally, firmly fixing the fracture in place distally.  X-rays  confirm reduction of the fracture.  Oblique x-rays revealed that there were  no pins within the articular surface.  The proximal screws were then placed  after drilling and measuring.  These measured 14 to 16 mm.  X-rays revealed  that the plate was well aligned.  However,  the 16 mm screws were slightly  long and these were readjusted 14 mm.  Repeat lateral and oblique x-rays  revealed that the screws and plate lay in good position. The wound was  irrigated.  The periosteum  pronator was closed with 2-0 Vicryl sutures, the  subcutaneous tissue with 2-0 Vicryl after irrigation and the skin with  interrupted and running 5-0 nylon sutures.  A sterile compressive dressing  and splint was applied.  The patient was reprepped and redraped after  realignment for her left wrist.  Again this was in a supine position.  Prep  was done with DuraPrep after draping.  Exsanguination of the limb with an  Esmarch bandage, tourniquet placed high on the arm was inflated 250 mmHg.  The old incision was used, carried down through subcutaneous tissue,  multiple subcutaneous sutures were removed having poked through the skin.  The dissection carried down between the flexor  carpi radialis and radial  artery.  The radial artery appeared to have thrombosed.  This was not  further attended to in that she had good circulation.  The dissection  carried down to the distal radius.  The Stryker plate was then removed  without difficulty.  The fracture was found to be relatively healed on the  ulnar aspect with a nonunion on the radial side with proximal migration.  The dissection was extended dorsally.  The fracture fragments were freed up.  It was decided to proceed with reduction by placing the distal pins first.  The plate was placed in position and the distal pins and screws were then  placed distally. These confirmed that they were not within the articular  surface.  These were 22 to 20 mm and using combination of screws and pins  the plate was approximated onto the shaft and drilled, tapped and screwed  into position with screws measuring 14  to 12 mm.  X-rays confirmed that the  fracture was reduced in both AP and lateral direction.  The remaining distal  pins were then placed.  These measured 22 mm.  Each were  placed.  X-rays  taken in the AP, lateral and oblique direction to confirm that no pins were  in the articular surface showed the fracture well aligned with  reconstitution of length and neutral to slightly volar tilt.  This was  deemed adequate, the wound was irrigated, the periosteum and pronator closed  with figure-of-eight 2-0 Vicryl sutures and subcutaneous tissue with 2-0  Vicryl suture and the skin with interrupted and running 5-0 nylon sutures.  A sterile compressive dressing and splint was applied to each hand.  The  patient tolerated the procedure well and was taken to the recovery room for  observation in satisfactory condition.  She was admitted for overnight stay.  She will be discharged on [place] NX for return to the office in one week.       GK/MEDQ  D:  03/31/2004  T:  04/03/2004  Job:  [id number]

## 2010-08-25 NOTE — Discharge Summary (Signed)
NAME:  Melanie, [name]                ACCOUNT NO.:  [id number]   MEDICAL RECORD NO.:  [id number]          PATIENT TYPE:  INP   LOCATION:  3715                         FACILITY:  [facility]   PHYSICIAN:  Richard A. Alanda Amass, M.D.DATE OF BIRTH:  Mar 17, 1926   DATE OF ADMISSION:  08/16/2005  DATE OF DISCHARGE:  08/21/2005                                 DISCHARGE SUMMARY   DISCHARGE DIAGNOSES:  1.  Unstable angina with elevated troponins this admission, catheterization      and subsequent circumflex intervention, Aug 17, 2005, by Dr. Jenne Campus      using a Liberte stent  2.  Residual total right coronary artery with left-to-right collaterals and      70% diagonal to be treated medically.  3.  Preserved left ventricular function, ejection fraction of 50%.  4.  Paroxysmal atrial fibrillation.  The patient declined to Coumadin.  She      is in sinus rhythm at discharge.  5.  Chronic obstructive pulmonary disease on home oxygen.  6.  Peripheral vascular disease with known 4.2-cm abdominal aortic aneurysm.  7.  Past history of gastrointestinal bleeding.  Stools negative this      admission.  8.  Multiple drug intolerances.  9.  Anxiety disorder.   HOSPITAL COURSE:  The patient is 77 year old female, followed by Dr.Cresenzo  and Dr. Alanda Amass.  She was seen by Dr. Domingo Sep, Aug 16, 2005, as an add-  on.  She was in [facility], in April 2007, with rapid atrial  fibrillation.  The patient stopped her Cardizem as an outpatient because she  felt it was causing her some nausea.  Then, she developed some chest  pressure.  She was admitted to [facility], started on IV heparin,  and enzymes were obtained.  These were abnormal with a troponin peak of  0.18.  CKs and MBs were negative.  We resumed her p.o. Cardizem.  We held  off on her Lanoxin because of her nausea.  Her dig level was somewhat  elevated on admission at 2.2.  She was set up for diagnostic catheterization  which was done by  Dr. Jenne Campus, Aug 17, 2005.  The patient had a 70% proximal  RCA, a total mid RCA with left-to-right collaterals.  The left main was  normal.  LAD was essentially normal.  The first diagonal had a 70% stenosis.  The proximal circumflex had a 60% stenosis, and the mid circumflex had an  80% stenosis.  She underwent PTCA and stenting using a Liberte stent by Dr.  Jenne Campus.  Her EF was 50%.  The patient was noted to be in atrial  fibrillation on arrival, was converted to sinus rhythm after admission.  She  has declined Coumadin, according to Dr. Jenne Campus.  We added a Statin.  She  was transferred to the floor and ambulated and we feel she can be discharged  on Aug 21, 2005.  Her heart rate did drop and to the low 50s and we cut her  Cardizem back a little at discharge.   She can follow up with Dr. Alanda Amass  in [place].   Beta-blocker is new and she seems to be tolerating this well in regards to  her COPD.   DISCHARGE MEDICATIONS:  1.  Cardizem CD 180 mg once a day.  2.  Lopressor 25 mg twice a day.  3.  Plavix 75 mg a day.  4.  Coated aspirin 81 mg a day.  5.  Nitroglycerin sublingual p.r.n.  6.  Xanax as taken prior to admission.  7.  Zocor 20 mg a day.   LABORATORY:  Sodium 139, potassium 3.7, BUN 11, creatinine 0.6.  Hemoglobin  9.3, hematocrit 28.2, white count 5.5, platelets 182.  Liver functions were  normal.  LDL was 114, HDL 58.  Stool was negative for blood x1.  D-dimer is  0.32.  Chest x-ray shows COPD, no CHF.  INR 0.9 on admission.   DISPOSITION:  The patient is discharged in stable condition.  She will  follow up with Dr. Alanda Amass in [place].  We did add a PPI at discharge  also.      Abelino Derrick, P.A.      Richard A. Alanda Amass, M.D.  Electronically Signed    LKK/MEDQ  D:  08/21/2005  T:  08/21/2005  Job:  [id number]   cc:   Gerlene Burdock A. Alanda Amass, M.D.  Fax: [phone]   Patrica Duel, M.D.  Fax: [phone]

## 2010-08-25 NOTE — Discharge Summary (Signed)
   NAME:  Melanie [name], Melanie [name]                          ACCOUNT NO.:  [id number]   MEDICAL RECORD NO.:  [id number]                   PATIENT TYPE:  INP   LOCATION:  A223                                 FACILITY:  [facility]   PHYSICIAN:  Patrica Duel, M.D.                 DATE OF BIRTH:  February 06, 1926   DATE OF ADMISSION:  01/25/2003  DATE OF DISCHARGE:  01/29/2003                                 DISCHARGE SUMMARY   DISCHARGE DIAGNOSES:  1. Exacerbation of chronic obstructive pulmonary disease.  2. Remote long-standing history of tobacco abuse.  3. Severe anxiety disorder, controlled.  4. History of multiple orthopedic procedures including bilateral hip     replacements, arthroscopy of the knees, bilateral procedures of shoulder.   HISTORY OF PRESENT ILLNESS:  The patient presented to the emergency  department with a one-week history of increasingly severe shortness of  breath and wheezing.  She denied rigors, sputum production, chest pain,  headache or other symptoms.  Her O2 saturation was 75% on admission.  She  was treated routinely and responded fairly well.  Arterial blood gas after  treatment on 2 L O2 revealed a pH of 7.43, pCO2 47, pO2 92.9.  Other  pertinent data includes normal electrolytes.  White count was 6000, H&H 11.2  and 35.  Chest x-ray revealed COPD with no acute infiltrate.  The patient  was admitted for definitive therapy and further evaluation of apparent  exacerbation of COPD.   HOSPITAL COURSE:  The patient was placed on concentrated care and underwent  routine therapy with steroids, pulmonary toilet and broad-spectrum  antibiotics.  Cultures were unobtainable due to lack of sputum production.  She remained afebrile.  Her respiratory status is improved remarkably.  Currently, she has scattered residual wheezes only and is stable for  discharge.  She has previously been O2-dependent and remains on O2 in the  hospital.  Currently, the patient is stable for discharge.   DISCHARGE MEDICATIONS:  1. Home nebulizers with DuoNeb.  2. Prednisone dose pack 5 mg 12-day.  3. Levaquin 250 mg q.d.  4. Theo-Dur 200 mg b.i.d.  5. Advair 500/50 one puff b.i.d.  6. Xanax 2 mg q.8h. p.r.n.  7. Parafon Forte 1 q.h.s. p.r.n.  8.     Fosamax 70 mg each week.  9. Halcion 0.25 mg q.h.s. p.r.n.   FOLLOW UP:  She will be followed and treated expectantly as an outpatient.  Follow up in one week or p.r.n.    ___________________________________________                                         Patrica Duel, M.D.   MC/MEDQ  D:  01/29/2003  T:  01/29/2003  Job:  [id number]

## 2010-08-25 NOTE — H&P (Signed)
NAME:  Melanie [name], Melanie [name]                          ACCOUNT NO.:  [id number]   MEDICAL RECORD NO.:  [id number]                   PATIENT TYPE:  INP   LOCATION:  A223                                 FACILITY:  [facility]   PHYSICIAN:  Patrica Duel, M.D.                 DATE OF BIRTH:  March 04, 1926   DATE OF ADMISSION:  01/25/2003  DATE OF DISCHARGE:                                HISTORY & PHYSICAL   CHIEF COMPLAINT:  Shortness of breath and wheezing.   HISTORY OF PRESENT ILLNESS:  This is a 77 year old female with a history of  severe COPD.  She was admitted approximately one year ago with a severe  exacerbation and was O2-dependent for some time thereafter.  She did quit  smoking and has improved and has done relatively well since.  She also has a  history of severe anxiety.  This has been well controlled lately.  She also  has a history of multiple orthopedic procedures including bilateral hip  replacements, arthroscopy of the knees, and bilateral procedures for  epicondylitis.  She also has had a shoulder procedure.   The patient presented to the emergency department with a one-week history of  increasingly severe shortness of breath and wheezing.  She denies  significant cough or sputum production, rigor, chest pain, headache,  neurological deficits, nausea, vomiting, diarrhea, melena, hematemesis,  hematochezia, or genitourinary symptoms.   In the emergency department the patient's pulse oximetry was 75% on  admission.  She was treated routinely and responded reasonably well.  After  receiving two nebulizer treatments her arterial blood gas on 2 L revealed a  pH of 7.43, pCO2 of 47, and a pO2 of 92.9.  Other pertinent data includes  normal electrolytes.  White count is 6000, H&H 11.2 and 35.   Chest x-ray revealed COPD with no acute infiltrates.   The patient is added for definitive therapy and further evaluation of  apparent exacerbation of COPD.   CURRENT MEDICATIONS:  1. Xanax 2  mg q.8h.  2. Parafon Forte 1 q.h.s.  3. Fosamax 70 mg each week.  4. Halcion 0.25 mg q.h.s.  5. DuoNeb by home nebulizers.   ALLERGIES:  PENICILLIN, SULFA, CODEINE.   PAST MEDICAL HISTORY:  As noted above.   SOCIAL HISTORY:  She quit smoking one year ago.  She denies use of alcohol.  She lives alone.   FAMILY HISTORY:  Noncontributory.   REVIEW OF SYSTEMS:  As noted above.   PHYSICAL EXAM:  GENERAL:  Very pleasant, somewhat agitated female who is  currently fully alert and oriented.  VITAL SIGNS:  On presentation temperature 97.1, pulse 107, respirations 38  and moderately labored, blood pressure 139/82, O2 saturation as noted.  HEENT:  Normocephalic, atraumatic.  Pupils are equal.  Ears, nose, throat  are benign.  NECK:  Supple.  No bruits audible.  LUNGS:  Diminished breath sounds diffusely.  She also has diffuse fine  expiratory wheezes.  HEART:  Heart sounds with no murmurs, rubs, or gallops noted.  ABDOMEN:  Nontender, nondistended.  Bowel sounds are intact.  EXTREMITIES:  No clubbing, cyanosis, or edema.  NEUROLOGIC:  Within normal limits.   PERTINENT LABORATORIES:  As noted above.   ASSESSMENT:  Exacerbation of chronic obstructive pulmonary disease.   PLAN:  Pulmonary toilet.  Broad-spectrum antibiotics.  Sputum culture, Gram  stain.  The family requests Dr. Juanetta Gosling' input, which I fully agree with.  Will also begin aminophylline drip as well as continue steroids IV.     ___________________________________________                                         Patrica Duel, M.D.   MC/MEDQ  D:  01/25/2003  T:  01/25/2003  Job:  [id number]

## 2010-09-04 ENCOUNTER — Ambulatory Visit (HOSPITAL_COMMUNITY): Payer: Medicare Other | Attending: Internal Medicine

## 2010-09-04 DIAGNOSIS — Z96649 Presence of unspecified artificial hip joint: Secondary | ICD-10-CM | POA: Insufficient documentation

## 2010-09-04 DIAGNOSIS — M25559 Pain in unspecified hip: Secondary | ICD-10-CM | POA: Insufficient documentation

## 2010-10-13 ENCOUNTER — Ambulatory Visit (HOSPITAL_COMMUNITY): Admission: EM | Admit: 2010-10-13 | Payer: Medicare Other | Admitting: Emergency Medicine

## 2010-10-13 ENCOUNTER — Inpatient Hospital Stay (HOSPITAL_COMMUNITY): DRG: 948 | Payer: Medicare Other | Attending: Emergency Medicine

## 2010-10-13 DIAGNOSIS — Z139 Encounter for screening, unspecified: Secondary | ICD-10-CM | POA: Diagnosis present

## 2010-10-13 LAB — CBC
HCT: 34.2 % — ABNORMAL LOW (ref 36.0–46.0)
Hemoglobin: 11.1 g/dL — ABNORMAL LOW (ref 12.0–15.0)
MCH: 32 pg (ref 26.0–34.0)
MCHC: 32.5 g/dL (ref 30.0–36.0)
MCV: 98.6 fL (ref 78.0–100.0)
Platelets: 203 10*3/uL (ref 150–400)
RBC: 3.47 MIL/uL — ABNORMAL LOW (ref 3.87–5.11)
RDW: 13.5 % (ref 11.5–15.5)
WBC: 10.2 10*3/uL (ref 4.0–10.5)

## 2010-10-13 LAB — COMPREHENSIVE METABOLIC PANEL
Albumin: 3.6 g/dL (ref 3.5–5.2)
Alkaline Phosphatase: 67 U/L (ref 39–117)
BUN: 13 mg/dL (ref 6–23)
Calcium: 8.9 mg/dL (ref 8.4–10.5)
Creatinine, Ser: 0.69 mg/dL (ref 0.50–1.10)
GFR calc Af Amer: >60 mL/min (ref >60)
Potassium: 3.7 mEq/L (ref 3.5–5.1)
Total Protein: 7.3 g/dL (ref 6.0–8.3)

## 2010-10-13 LAB — URINE MICROSCOPIC-ADD ON

## 2010-10-13 LAB — DIFFERENTIAL
Lymphs Abs: 0.7 10*3/uL (ref 0.7–4.0)
Monocytes Relative: 7 % (ref 3–12)
Neutro Abs: 8.7 10*3/uL — ABNORMAL HIGH (ref 1.7–7.7)
Neutrophils Relative %: 85 % — ABNORMAL HIGH (ref 43–77)

## 2010-10-13 LAB — URINALYSIS, ROUTINE W REFLEX MICROSCOPIC
Nitrite: NEGATIVE
Specific Gravity, Urine: 1.02 (ref 1.005–1.030)
Urobilinogen, UA: 0.2 mg/dL (ref 0.0–1.0)
pH: 6.5 (ref 5.0–8.0)

## 2010-10-13 LAB — PROCALCITONIN: Procalcitonin: <0.1 ng/mL

## 2010-10-18 LAB — CULTURE, BLOOD (ROUTINE X 2)

## 2010-10-19 LAB — URINE CULTURE
Colony Count: 80000
Culture  Setup Time: 201207071933

## 2010-12-30 ENCOUNTER — Encounter: Payer: Self-pay | Admitting: Emergency Medicine

## 2010-12-30 ENCOUNTER — Emergency Department (HOSPITAL_COMMUNITY)
Admission: EM | Admit: 2010-12-30 | Discharge: 2010-12-30 | Disposition: A | Discharge status: Skilled Nursing Facility | Payer: Medicare Other | Attending: Emergency Medicine | Admitting: Emergency Medicine

## 2010-12-30 ENCOUNTER — Emergency Department (HOSPITAL_COMMUNITY): Payer: Medicare Other

## 2010-12-30 ENCOUNTER — Inpatient Hospital Stay
Admission: RE | Admit: 2010-12-30 | Discharge: 2011-06-03 | Disposition: A | Discharge status: Other Acute Inpatient Hospital | Payer: PRIVATE HEALTH INSURANCE | Source: Ambulatory Visit | Attending: Internal Medicine | Admitting: Internal Medicine

## 2010-12-30 DIAGNOSIS — W19XXXA Unspecified fall, initial encounter: Secondary | ICD-10-CM | POA: Insufficient documentation

## 2010-12-30 DIAGNOSIS — M79604 Pain in right leg: Secondary | ICD-10-CM

## 2010-12-30 DIAGNOSIS — I2510 Atherosclerotic heart disease of native coronary artery without angina pectoris: Secondary | ICD-10-CM | POA: Insufficient documentation

## 2010-12-30 DIAGNOSIS — R609 Edema, unspecified: Secondary | ICD-10-CM

## 2010-12-30 DIAGNOSIS — I4891 Unspecified atrial fibrillation: Secondary | ICD-10-CM | POA: Insufficient documentation

## 2010-12-30 DIAGNOSIS — J4489 Other specified chronic obstructive pulmonary disease: Secondary | ICD-10-CM | POA: Insufficient documentation

## 2010-12-30 DIAGNOSIS — M25559 Pain in unspecified hip: Secondary | ICD-10-CM | POA: Insufficient documentation

## 2010-12-30 DIAGNOSIS — Y9210 Unspecified residential institution as the place of occurrence of the external cause: Secondary | ICD-10-CM | POA: Insufficient documentation

## 2010-12-30 DIAGNOSIS — I252 Old myocardial infarction: Secondary | ICD-10-CM | POA: Insufficient documentation

## 2010-12-30 DIAGNOSIS — M549 Dorsalgia, unspecified: Secondary | ICD-10-CM | POA: Insufficient documentation

## 2010-12-30 DIAGNOSIS — J449 Chronic obstructive pulmonary disease, unspecified: Secondary | ICD-10-CM | POA: Insufficient documentation

## 2010-12-30 DIAGNOSIS — R05 Cough: Principal | ICD-10-CM

## 2010-12-30 HISTORY — DX: Pneumonia, unspecified organism: J18.9

## 2010-12-30 HISTORY — DX: Fracture of one rib, unspecified side, initial encounter for closed fracture: S22.39XA

## 2010-12-30 HISTORY — DX: Hypokalemia: E87.6

## 2010-12-30 HISTORY — DX: Chronic obstructive pulmonary disease, unspecified: J44.9

## 2010-12-30 HISTORY — DX: Unspecified atrial fibrillation: I48.91

## 2010-12-30 HISTORY — DX: Acute myocardial infarction, unspecified: I21.9

## 2010-12-30 HISTORY — DX: Unspecified dementia, unspecified severity, without behavioral disturbance, psychotic disturbance, mood disturbance, and anxiety: F03.90

## 2010-12-30 HISTORY — DX: Atherosclerotic heart disease of native coronary artery without angina pectoris: I25.10

## 2010-12-30 MED ORDER — HYDROCODONE-ACETAMINOPHEN 5-325 MG PO TABS: 1.0000 | ORAL_TABLET | Freq: Four times a day (QID) | ORAL | Status: AC | PRN

## 2010-12-30 NOTE — ED Provider Notes (Signed)
History    Scribed for Melanie Lennert, MD, the patient was seen in room APA04/APA04. This chart was scribed by Melanie [name]. This patient's care was started at 5:22 PM.     CSN: [id number] Arrival date & time: 12/30/2010  5:05 PM  Chief Complaint  Patient presents with  . Hip Pain    HPI  (Consider location/radiation/quality/duration/timing/severity/associated sxs/prior treatment)  HPI History is provided by patient and [facility] staff.   Melanie [name] is a 77 y.o. female who presents to the Emergency Department complaining of persistent right hip pain that radiates to back that began 3 weeks ago.  Patient resides at [facility].   Patient states that she fell several months ago.  [facility] staff report that pain is not controlled by Vicodin and Flexeril.  Patient has hx of COPD, MI, Dementia and rib fx.     PAST MEDICAL HISTORY:  Past Medical History  Diagnosis Date  . COPD (chronic obstructive pulmonary disease)   . Atrial fibrillation   . CAD (coronary artery disease)   . MI (myocardial infarction)   . Dementia   . Hypokalemia   . Pneumonia   . Rib fracture     PAST SURGICAL HISTORY:  Past Surgical History  Procedure Date  . Abdominal hysterectomy   . Appendectomy     FAMILY HISTORY:  No family history on file.   SOCIAL HISTORY: History   Social History  . Marital Status: Widowed    Spouse Name: N/A    Number of Children: N/A  . Years of Education: N/A   Social History Main Topics  . Smoking status: Former Games developer  . Smokeless tobacco: None  . Alcohol Use: No  . Drug Use: No  . Sexually Active:    Other Topics Concern  . None   Social History Narrative  . None     Review of Systems  Review of Systems  Constitutional: Negative for fever and fatigue.  HENT: Negative for congestion, sinus pressure and ear discharge.   Eyes: Negative for discharge.  Respiratory: Negative for cough. Shortness of breath: baseline, COPD      Cardiovascular: Negative for chest pain.  Gastrointestinal: Negative for abdominal pain and diarrhea.  Genitourinary: Negative for frequency and hematuria.  Musculoskeletal: Positive for back pain.  Skin: Negative for rash.  Neurological: Negative for seizures and headaches.  Hematological: Negative.   Psychiatric/Behavioral: Negative for hallucinations.    Allergies  Penicillins and Sulfa antibiotics  Home Medications  No current outpatient prescriptions on file.  Physical Exam    BP 137/60  Pulse 68  Temp(Src) 97.8 F (36.6 C) (Oral)  Resp 16  Ht 5' (1.524 m)  Wt 93 lb (42.185 kg)  BMI 18.16 kg/m2  SpO2 99%  Physical Exam  Constitutional: She is oriented to person, place, and time. She appears well-developed.  HENT:  Head: Normocephalic and atraumatic.  Mouth/Throat: Mucous membranes are dry.  Eyes: Conjunctivae and EOM are normal. No scleral icterus.  Neck: Neck supple. No thyromegaly present.  Cardiovascular: Normal rate and intact distal pulses.   Pulmonary/Chest: Effort normal. No stridor. She has wheezes. She has no rales. She exhibits no tenderness.       Patient using Percy during exam.  Mild wheezes throughout.    Abdominal: Soft. She exhibits no distension. There is no tenderness. There is no rebound.  Musculoskeletal: She exhibits tenderness. She exhibits no edema.       Tenderness in right hip that worsens  with flexion and rotation  Lymphadenopathy:    She has no cervical adenopathy.  Neurological: She is alert and oriented to person, place, and time. Coordination normal.  Skin: Skin is warm. No rash noted. No erythema.  Psychiatric: She has a normal mood and affect. Her behavior is normal.    ED Course  Procedures (including critical care time) OTHER DATA REVIEWED: Nursing notes, vital signs, and past medical records reviewed.   DIAGNOSTIC STUDIES: Oxygen Saturation is 99% on 2 liters/min via Patient connected to nasal cannula oxygen, normal by my  interpretation.       LABS / RADIOLOGY:   Dg Hip Complete Right  12/30/2010  *RADIOLOGY REPORT*  Clinical Data: Fall, right hip pain  RIGHT HIP - COMPLETE 2+ VIEW  Comparison: 09/04/2010  Findings: Bilateral total hip replacement.  Right hip replacement appears satisfactory.  No acute fracture or dislocation.  Chronic fracture of the inferior pubic rami bilaterally.  IMPRESSION: Satisfactory right hip replacement without acute bony abnormality.  Original Report Authenticated By: Melanie [name], M.D.     ED COURSE / COORDINATION OF CARE: 5:32 PM  Physical exam complete.  Will order XR of right hip.   7:03 PM  MD discussed XR results with patient. Plan to discharge patient.   Orders Placed This Encounter  Procedures  . DG Hip Complete Right    Mdm.  Chronic right hip pain   IMPRESSION: Diagnoses that have been ruled out:  Diagnoses that are still under consideration:  Final diagnoses:     MEDICATIONS GIVEN IN THE E.D. Scheduled Meds:   Continuous Infusions:      DISCHARGE MEDICATIONS: New Prescriptions   No medications on file     The chart was scribed for me under my direct supervision.  I personally performed the history, physical, and medical decision making and all procedures in the evaluation of this patient.Melanie Lennert, MD 12/30/10 [phone]

## 2010-12-30 NOTE — ED Notes (Signed)
Patient returned from x ray. NAD noted. Will continue to monitor.

## 2010-12-30 NOTE — ED Notes (Signed)
Patient with c/o right hip pain. Patient reports fall several months ago, but pain in right hip started 2 weeks ago. [facility] staff reports uncontrolled pain with vicodin and flexeril.

## 2011-01-01 ENCOUNTER — Other Ambulatory Visit (HOSPITAL_BASED_OUTPATIENT_CLINIC_OR_DEPARTMENT_OTHER): Payer: Self-pay | Admitting: Internal Medicine

## 2011-01-02 ENCOUNTER — Ambulatory Visit (HOSPITAL_COMMUNITY)
Admission: RE | Admit: 2011-01-02 | Discharge: 2011-01-02 | Disposition: A | Discharge status: Skilled Nursing Facility | Payer: Medicare Other | Source: Skilled Nursing Facility | Attending: Internal Medicine | Admitting: Internal Medicine

## 2011-01-02 DIAGNOSIS — K449 Diaphragmatic hernia without obstruction or gangrene: Secondary | ICD-10-CM | POA: Insufficient documentation

## 2011-01-02 DIAGNOSIS — K8020 Calculus of gallbladder without cholecystitis without obstruction: Secondary | ICD-10-CM | POA: Insufficient documentation

## 2011-01-02 DIAGNOSIS — R109 Unspecified abdominal pain: Secondary | ICD-10-CM | POA: Insufficient documentation

## 2011-01-02 DIAGNOSIS — K5730 Diverticulosis of large intestine without perforation or abscess without bleeding: Secondary | ICD-10-CM | POA: Insufficient documentation

## 2011-01-02 DIAGNOSIS — N200 Calculus of kidney: Secondary | ICD-10-CM | POA: Insufficient documentation

## 2011-01-02 DIAGNOSIS — M549 Dorsalgia, unspecified: Secondary | ICD-10-CM | POA: Insufficient documentation

## 2011-01-02 DIAGNOSIS — I714 Abdominal aortic aneurysm, without rupture, unspecified: Secondary | ICD-10-CM | POA: Insufficient documentation

## 2011-01-03 LAB — COMPREHENSIVE METABOLIC PANEL
ALT: 14
AST: 21
AST: 22
Albumin: 3.3 — ABNORMAL LOW
Alkaline Phosphatase: 59
Alkaline Phosphatase: 65
BUN: 10
CO2: >45 — ABNORMAL HIGH
Calcium: 8.7
Chloride: 100
Chloride: 92 — ABNORMAL LOW
Creatinine, Ser: 0.58
GFR calc Af Amer: >60
GFR calc Af Amer: >60
Glucose, Bld: 87
Potassium: 3.6
Sodium: 145
Total Bilirubin: 0.5

## 2011-01-03 LAB — CARDIAC PANEL(CRET KIN+CKTOT+MB+TROPI)
CK, MB: 4.4 — ABNORMAL HIGH
Relative Index: INVALID
Relative Index: INVALID
Total CK: 85
Total CK: 91
Troponin I: 0.02
Troponin I: 0.03

## 2011-01-03 LAB — DIFFERENTIAL
Basophils Absolute: 0
Basophils Absolute: 0
Basophils Relative: 0
Basophils Relative: 0
Basophils Relative: 0
Eosinophils Absolute: 0.2
Eosinophils Absolute: 0.2
Eosinophils Relative: 2
Lymphocytes Relative: 22
Lymphocytes Relative: 23
Lymphs Abs: 1.4
Monocytes Absolute: 0.6
Monocytes Absolute: 0.7
Monocytes Relative: 10
Monocytes Relative: 11
Monocytes Relative: 11
Neutro Abs: 3.8
Neutro Abs: 4.5
Neutrophils Relative %: 64
Neutrophils Relative %: 64

## 2011-01-03 LAB — BASIC METABOLIC PANEL
BUN: 11
BUN: 9
Calcium: 8.4
Calcium: 8.6
Chloride: 97
Creatinine, Ser: 0.53
Creatinine, Ser: 0.57
Creatinine, Ser: 0.58
GFR calc Af Amer: >60
GFR calc Af Amer: >60
GFR calc non Af Amer: >60
GFR calc non Af Amer: >60
Glucose, Bld: 95
Potassium: 3.4 — ABNORMAL LOW
Sodium: 145

## 2011-01-03 LAB — CROSSMATCH
ABO/RH(D): A POS
Antibody Screen: NEGATIVE

## 2011-01-03 LAB — CBC
HCT: 25.2 — ABNORMAL LOW
HCT: 28.9 — ABNORMAL LOW
HCT: 33.4 — ABNORMAL LOW
Hemoglobin: 9.2 — ABNORMAL LOW
MCHC: 31.7
MCHC: 32.3
MCV: 87.6
MCV: 88.8
Platelets: 195
Platelets: 210
Platelets: 212
RBC: 3.26 — ABNORMAL LOW
RBC: 3.82 — ABNORMAL LOW
RDW: 15.5
WBC: 5.9
WBC: 6.2
WBC: 6.8

## 2011-01-03 LAB — FOLATE: Folate: 10.8

## 2011-01-03 LAB — URINE CULTURE
Colony Count: >=100000
Special Requests: NEGATIVE

## 2011-01-03 LAB — LIPID PANEL
Cholesterol: 181
HDL: 52
Total CHOL/HDL Ratio: 3.5
Triglycerides: 89

## 2011-01-03 LAB — URINALYSIS, ROUTINE W REFLEX MICROSCOPIC
Bilirubin Urine: NEGATIVE
Glucose, UA: NEGATIVE
Hgb urine dipstick: NEGATIVE
Specific Gravity, Urine: 1.01
Urobilinogen, UA: 2 — ABNORMAL HIGH

## 2011-01-03 LAB — APTT: aPTT: 34

## 2011-01-03 LAB — TSH: TSH: 4.393

## 2011-01-03 LAB — HEMOGLOBIN AND HEMATOCRIT, BLOOD: HCT: 24.9 — ABNORMAL LOW

## 2011-01-03 LAB — URINE MICROSCOPIC-ADD ON

## 2011-01-03 LAB — IRON AND TIBC
Saturation Ratios: 9 — ABNORMAL LOW
TIBC: 351

## 2011-01-03 LAB — PROTIME-INR: Prothrombin Time: 12.4

## 2011-01-03 LAB — FERRITIN: Ferritin: 55 (ref 10–291)

## 2011-01-03 LAB — RETICULOCYTES: Retic Ct Pct: 2

## 2011-01-05 LAB — COMPREHENSIVE METABOLIC PANEL
ALT: 15
AST: 25
Albumin: 4.1
Calcium: 9.3
Creatinine, Ser: 0.71
GFR calc Af Amer: >60
GFR calc non Af Amer: >60
Sodium: 142
Total Protein: 6.6

## 2011-01-05 LAB — CBC
MCHC: 32
MCV: 95.5
Platelets: 234
RBC: 3.6 — ABNORMAL LOW
RDW: 16.7 — ABNORMAL HIGH

## 2011-01-05 LAB — DIFFERENTIAL
Eosinophils Absolute: 0.1
Eosinophils Relative: 1
Lymphocytes Relative: 28
Lymphs Abs: 1.3
Monocytes Relative: 8

## 2011-01-22 ENCOUNTER — Ambulatory Visit (HOSPITAL_COMMUNITY): Payer: Medicare Other | Attending: Internal Medicine

## 2011-01-22 DIAGNOSIS — R0602 Shortness of breath: Secondary | ICD-10-CM | POA: Insufficient documentation

## 2011-01-22 DIAGNOSIS — R059 Cough, unspecified: Secondary | ICD-10-CM | POA: Insufficient documentation

## 2011-01-22 DIAGNOSIS — R918 Other nonspecific abnormal finding of lung field: Secondary | ICD-10-CM | POA: Insufficient documentation

## 2011-01-22 DIAGNOSIS — R05 Cough: Secondary | ICD-10-CM | POA: Insufficient documentation

## 2011-03-09 ENCOUNTER — Ambulatory Visit (HOSPITAL_COMMUNITY)
Admit: 2011-03-09 | Discharge: 2011-03-09 | Disposition: A | Discharge status: Skilled Nursing Facility | Payer: Medicare Other | Source: Skilled Nursing Facility | Attending: Internal Medicine | Admitting: Internal Medicine

## 2011-03-09 DIAGNOSIS — M79609 Pain in unspecified limb: Secondary | ICD-10-CM | POA: Insufficient documentation

## 2011-03-09 DIAGNOSIS — M7989 Other specified soft tissue disorders: Secondary | ICD-10-CM | POA: Insufficient documentation

## 2011-03-10 ENCOUNTER — Ambulatory Visit (HOSPITAL_COMMUNITY)
Admission: RE | Admit: 2011-03-10 | Discharge: 2011-03-10 | Disposition: A | Discharge status: Skilled Nursing Facility | Payer: Medicare Other | Source: Skilled Nursing Facility | Attending: Internal Medicine | Admitting: Internal Medicine

## 2011-03-10 ENCOUNTER — Ambulatory Visit (HOSPITAL_COMMUNITY): Payer: Medicare Other

## 2011-03-10 DIAGNOSIS — M79609 Pain in unspecified limb: Secondary | ICD-10-CM | POA: Insufficient documentation

## 2011-03-10 DIAGNOSIS — Z96649 Presence of unspecified artificial hip joint: Secondary | ICD-10-CM | POA: Insufficient documentation

## 2011-04-18 ENCOUNTER — Ambulatory Visit (HOSPITAL_COMMUNITY): Payer: Medicare Other | Attending: Internal Medicine

## 2011-04-18 DIAGNOSIS — J438 Other emphysema: Secondary | ICD-10-CM | POA: Insufficient documentation

## 2011-04-18 DIAGNOSIS — R062 Wheezing: Secondary | ICD-10-CM | POA: Insufficient documentation

## 2011-06-03 ENCOUNTER — Emergency Department (HOSPITAL_COMMUNITY): Payer: Medicare Other

## 2011-06-03 ENCOUNTER — Other Ambulatory Visit: Payer: Self-pay

## 2011-06-03 ENCOUNTER — Emergency Department (HOSPITAL_COMMUNITY)
Admission: EM | Admit: 2011-06-03 | Discharge: 2011-06-03 | Disposition: A | Discharge status: Skilled Nursing Facility | Payer: Medicare Other | Attending: Emergency Medicine | Admitting: Emergency Medicine

## 2011-06-03 ENCOUNTER — Encounter (HOSPITAL_COMMUNITY): Payer: Self-pay

## 2011-06-03 DIAGNOSIS — I219 Acute myocardial infarction, unspecified: Secondary | ICD-10-CM | POA: Insufficient documentation

## 2011-06-03 DIAGNOSIS — I2510 Atherosclerotic heart disease of native coronary artery without angina pectoris: Secondary | ICD-10-CM | POA: Insufficient documentation

## 2011-06-03 DIAGNOSIS — Z66 Do not resuscitate: Secondary | ICD-10-CM | POA: Insufficient documentation

## 2011-06-03 DIAGNOSIS — J4489 Other specified chronic obstructive pulmonary disease: Secondary | ICD-10-CM | POA: Insufficient documentation

## 2011-06-03 DIAGNOSIS — J449 Chronic obstructive pulmonary disease, unspecified: Secondary | ICD-10-CM | POA: Insufficient documentation

## 2011-06-03 DIAGNOSIS — R000 Tachycardia, unspecified: Secondary | ICD-10-CM | POA: Insufficient documentation

## 2011-06-03 DIAGNOSIS — I4892 Unspecified atrial flutter: Secondary | ICD-10-CM | POA: Insufficient documentation

## 2011-06-03 DIAGNOSIS — I252 Old myocardial infarction: Secondary | ICD-10-CM | POA: Insufficient documentation

## 2011-06-03 DIAGNOSIS — F0390 Unspecified dementia without behavioral disturbance: Secondary | ICD-10-CM | POA: Insufficient documentation

## 2011-06-03 DIAGNOSIS — I4891 Unspecified atrial fibrillation: Secondary | ICD-10-CM | POA: Insufficient documentation

## 2011-06-03 LAB — URINALYSIS, ROUTINE W REFLEX MICROSCOPIC
Ketones, ur: NEGATIVE mg/dL
Leukocytes, UA: NEGATIVE
Nitrite: NEGATIVE
Urobilinogen, UA: 0.2 mg/dL (ref 0.0–1.0)
pH: 6 (ref 5.0–8.0)

## 2011-06-03 LAB — DIFFERENTIAL
Basophils Absolute: 0 10*3/uL (ref 0.0–0.1)
Basophils Relative: 0 % (ref 0–1)
Eosinophils Absolute: 0.3 10*3/uL (ref 0.0–0.7)
Monocytes Absolute: 0.6 10*3/uL (ref 0.1–1.0)
Neutro Abs: 2.8 10*3/uL (ref 1.7–7.7)
Neutrophils Relative %: 55 % (ref 43–77)

## 2011-06-03 LAB — D-DIMER, QUANTITATIVE: D-Dimer, Quant: 0.61 ug/mL-FEU — ABNORMAL HIGH (ref 0.00–0.48)

## 2011-06-03 LAB — BASIC METABOLIC PANEL
Chloride: 96 mEq/L (ref 96–112)
Creatinine, Ser: 0.81 mg/dL (ref 0.50–1.10)
GFR calc Af Amer: 75 mL/min — ABNORMAL LOW (ref >90)
GFR calc non Af Amer: 65 mL/min — ABNORMAL LOW (ref >90)
Potassium: 3.8 mEq/L (ref 3.5–5.1)

## 2011-06-03 LAB — URINE MICROSCOPIC-ADD ON

## 2011-06-03 LAB — CBC
MCH: 29.9 pg (ref 26.0–34.0)
MCHC: 29.8 g/dL — ABNORMAL LOW (ref 30.0–36.0)
RDW: 14.1 % (ref 11.5–15.5)

## 2011-06-03 LAB — PRO B NATRIURETIC PEPTIDE: Pro B Natriuretic peptide (BNP): 4787 pg/mL — ABNORMAL HIGH (ref 0–450)

## 2011-06-03 LAB — TROPONIN I: Troponin I: <0.3 ng/mL (ref <0.30)

## 2011-06-03 MED ORDER — SODIUM CHLORIDE 0.9 % IV SOLN
INTRAVENOUS | Status: DC
  Administered 2011-06-03: 18:00:00 via INTRAVENOUS

## 2011-06-03 MED ORDER — SODIUM CHLORIDE 0.9 % IV BOLUS (SEPSIS)
500.0000 mL | Freq: Once | INTRAVENOUS | Status: AC
  Administered 2011-06-03: 500 mL via INTRAVENOUS

## 2011-06-03 MED ORDER — FUROSEMIDE 40 MG PO TABS
20.0000 mg | ORAL_TABLET | Freq: Once | ORAL | Status: AC
  Administered 2011-06-03: 20 mg via ORAL
  Filled 2011-06-03: qty 1

## 2011-06-03 MED ORDER — METOPROLOL TARTRATE 12.5 MG HALF TABLET: 12.5000 mg | ORAL_TABLET | Freq: Once | ORAL | Status: DC

## 2011-06-03 MED ORDER — ALBUTEROL SULFATE (5 MG/ML) 0.5% IN NEBU
2.5000 mg | INHALATION_SOLUTION | Freq: Once | RESPIRATORY_TRACT | Status: AC
  Administered 2011-06-03: 2.5 mg via RESPIRATORY_TRACT
  Filled 2011-06-03: qty 0.5

## 2011-06-03 MED ORDER — METOPROLOL TARTRATE 12.5 MG HALF TABLET: 12.5000 mg | ORAL_TABLET | Freq: Two times a day (BID) | ORAL | Status: AC

## 2011-06-03 MED ORDER — FUROSEMIDE 40 MG PO TABS: 20.0000 mg | ORAL_TABLET | Freq: Once | ORAL | Status: DC

## 2011-06-03 MED ORDER — ASPIRIN 81 MG PO CHEW
324.0000 mg | CHEWABLE_TABLET | Freq: Once | ORAL | Status: AC
  Administered 2011-06-03: 324 mg via ORAL
  Filled 2011-06-03: qty 4

## 2011-06-03 MED ORDER — ALBUTEROL SULFATE (2.5 MG/3ML) 0.083% IN NEBU: 2.5000 mg | INHALATION_SOLUTION | Freq: Four times a day (QID) | RESPIRATORY_TRACT | Status: DC | PRN

## 2011-06-03 MED ORDER — DILTIAZEM HCL 50 MG/10ML IV SOLN
5.0000 mg | Freq: Once | INTRAVENOUS | Status: AC
  Administered 2011-06-03: 5 mg via INTRAVENOUS
  Filled 2011-06-03: qty 10

## 2011-06-03 MED ORDER — METOPROLOL TARTRATE 1 MG/ML IV SOLN
5.0000 mg | Freq: Once | INTRAVENOUS | Status: AC
  Administered 2011-06-03: 5 mg via INTRAVENOUS
  Filled 2011-06-03: qty 5

## 2011-06-03 MED ORDER — ALBUTEROL SULFATE (2.5 MG/3ML) 0.083% IN NEBU: 2.5000 mg | INHALATION_SOLUTION | RESPIRATORY_TRACT | Status: AC | PRN

## 2011-06-03 MED ORDER — DEXTROSE 5 % IV SOLN
5.0000 mg/h | INTRAVENOUS | Status: DC
  Filled 2011-06-03: qty 100

## 2011-06-03 MED ORDER — METOPROLOL TARTRATE 12.5 MG HALF TABLET
12.5000 mg | ORAL_TABLET | Freq: Once | ORAL | Status: AC
  Administered 2011-06-03: 12.5 mg via ORAL
  Filled 2011-06-03: qty 1

## 2011-06-03 MED ORDER — IPRATROPIUM BROMIDE 0.02 % IN SOLN
0.5000 mg | Freq: Once | RESPIRATORY_TRACT | Status: AC
  Administered 2011-06-03: 0.5 mg via RESPIRATORY_TRACT
  Filled 2011-06-03: qty 2.5

## 2011-06-03 MED ORDER — METOPROLOL TARTRATE 50 MG PO TABS
ORAL_TABLET | ORAL | Status: AC
  Administered 2011-06-03: 12.5 mg via ORAL
  Filled 2011-06-03: qty 1

## 2011-06-03 NOTE — ED Notes (Signed)
Dr Cy Blamer notified of pt status and VS.

## 2011-06-03 NOTE — ED Notes (Signed)
Pt sent from [facility] for Tachycardia. Pt has chronic SOB but denies chest pain. Per report pt was given Lopressor.

## 2011-06-03 NOTE — ED Notes (Signed)
Spoke with [facility] staff and staff member states she was unaware of D/C. Per staff member she will be here.

## 2011-06-03 NOTE — ED Notes (Signed)
Resp at bedside

## 2011-06-03 NOTE — ED Notes (Signed)
[facility] Staff at bedside. IV's D/C with cath intact. D/C instructions reviewed with pt, daughter and [facility]. Pt back to [facility] via w/c. NAD at this time.

## 2011-06-03 NOTE — ED Provider Notes (Addendum)
This chart was scribed for No att. providers found by Williemae Natter. The patient was seen in room APOTF/NONE at 4:55 PM.  CSN: [id number]  Arrival date & time 06/03/11  1645   None     Chief Complaint  Patient presents with  . Tachycardia    (Consider location/radiation/quality/duration/timing/severity/associated sxs/prior treatment) HPI Level 5 Caveat due to dementia Melanie [name] is a 78 y.o. female who presents to the Emergency Department complaining of shortness of breath. Pt denies any chest pain and was sent here from [facility] for being tachycardic. ST elevated MI. Pt is DNR.  Past Medical History  Diagnosis Date  . COPD (chronic obstructive pulmonary disease)   . Atrial fibrillation   . CAD (coronary artery disease)   . MI (myocardial infarction)   . Dementia   . Hypokalemia   . Pneumonia   . Rib fracture     Past Surgical History  Procedure Date  . Abdominal hysterectomy   . Appendectomy     No family history on file.  History  Substance Use Topics  . Smoking status: Former Games developer  . Smokeless tobacco: Not on file  . Alcohol Use: No    OB History    Grav Para Term Preterm Abortions TAB SAB Ect Mult Living                  Review of Systems  Unable to perform ROS: Dementia     Allergies  Penicillins and Sulfa antibiotics  Home Medications   Current Outpatient Rx  Name Route Sig Dispense Refill  . ALBUTEROL SULFATE (2.5 MG/3ML) 0.083% IN NEBU Nebulization Take 3 mLs (2.5 mg total) by nebulization every 4 (four) hours as needed for wheezing. 150 mL 12  . METOPROLOL TARTRATE 12.5 MG HALF TABLET Oral Take 0.5 tablets (12.5 mg total) by mouth 2 (two) times daily. 60 tablet 0    BP 90/62  Pulse 125  Temp(Src) 98.2 F (36.8 C) (Oral)  Resp 19  Ht 5' (1.524 m)  Wt 93 lb (42.185 kg)  BMI 18.16 kg/m2  SpO2 99%  Physical Exam  Nursing note and vitals reviewed. Constitutional: She appears well-developed and well-nourished.  HENT:  Head:  Normocephalic and atraumatic.  Eyes: Conjunctivae and EOM are normal. Pupils are equal, round, and reactive to light.  Neck: Normal range of motion. Neck supple.  Cardiovascular: Normal rate and normal heart sounds.   Pulmonary/Chest: Effort normal and breath sounds normal.  Neurological: She is alert.  Skin: Skin is warm and dry.  Psychiatric: She has a normal mood and affect. Her behavior is normal.    ED Course  Procedures (including critical care time)  Date: 06/03/2011- 16:46  Rate: 128  Rhythm: atrial flutter  QRS Axis: normal  Intervals: QT normal  ST/T Wave abnormalities: ST elevations inferiorly  Conduction Disutrbances:none  Narrative Interpretation: AF with 2:1 block  Old EKG Reviewed: no old tracing available to view   Date: 06/03/2011-- 18:13  Rate: 125  Rhythm: atrial fibrillation  QRS Axis: normal  Intervals: QTc longer than earlier today  ST/T Wave abnormalities: ST elevations inferiorly  Conduction Disutrbances:none  Narrative Interpretation:   Old EKG Reviewed: changes noted        DIAGNOSTIC STUDIES: Oxygen Saturation is 97% on , normal by my interpretation.   17:02- Discussed with the STEMI doctor, Dr. Allyson Sabal, pt will be treated here d/t DNR status. 18:05 - Repeat EKG with similar results as before. 5 mg of Lopressor ordered, could  not be given d/t hypotension. 19:46- Pt has been fluid bolused several times d/t persistent hypotension.  Now she is wheezing. O2 sat remains normal. Pt is getting more SOB now. Neb ordered. I am concerned that she is going into pulmonary edema. 20:24- Blood pressure is 113/81 heart rate is 116. Pt is doing well after breathing treatment. Options discussed with daughter on whether to take pt [facility] or admit here.  21:12- Blood pressure is 95/60 heart rate is 101   COORDINATION OF CARE:  Medications  albuterol (PROVENTIL) (2.5 MG/3ML) 0.083% nebulizer solution (not administered)  metoprolol tartrate  (LOPRESSOR) 12.5 mg TABS (not administered)  albuterol (PROVENTIL) (2.5 MG/3ML) 0.083% nebulizer solution (not administered)  aspirin chewable tablet 324 mg (324 mg Oral Given 06/03/11 1659)  sodium chloride 0.9 % bolus 500 mL (500 mL Intravenous Given 06/03/11 1715)  diltiazem (CARDIZEM) injection SOLN 5 mg (5 mg Intravenous Given 06/03/11 1732)  sodium chloride 0.9 % bolus 500 mL (500 mL Intravenous Given 06/03/11 1815)  metoprolol (LOPRESSOR) injection 5 mg (5 mg Intravenous Given 06/03/11 2043)  sodium chloride 0.9 % bolus 500 mL (500 mL Intravenous Given 06/03/11 1901)  albuterol (PROVENTIL) (5 MG/ML) 0.5% nebulizer solution 2.5 mg (2.5 mg Nebulization Given 06/03/11 1933)  ipratropium (ATROVENT) nebulizer solution 0.5 mg (0.5 mg Nebulization Given 06/03/11 1933)  metoprolol tartrate (LOPRESSOR) tablet 12.5 mg (12.5 mg Oral Given 06/03/11 2145)  furosemide (LASIX) tablet 20 mg (20 mg Oral Given 06/03/11 2143)      Labs Reviewed  CBC - Abnormal; Notable for the following:    RBC 2.44 (*)    Hemoglobin 7.3 (*)    HCT 24.5 (*)    MCV 100.4 (*)    MCHC 29.8 (*)    All other components within normal limits  BASIC METABOLIC PANEL - Abnormal; Notable for the following:    CO2 40 (*)    GFR calc non Af Amer 65 (*)    GFR calc Af Amer 75 (*)    All other components within normal limits  URINALYSIS, ROUTINE W REFLEX MICROSCOPIC - Abnormal; Notable for the following:    Hgb urine dipstick TRACE (*)    All other components within normal limits  PRO B NATRIURETIC PEPTIDE - Abnormal; Notable for the following:    Pro B Natriuretic peptide (BNP) 4787.0 (*)    All other components within normal limits  D-DIMER, QUANTITATIVE - Abnormal; Notable for the following:    D-Dimer, Quant 0.61 (*)    All other components within normal limits  URINE MICROSCOPIC-ADD ON - Abnormal; Notable for the following:    Casts HYALINE CASTS (*)    All other components within normal limits  TROPONIN I  DIFFERENTIAL    URINE CULTURE   Dg Chest Port 1 View  06/03/2011  *RADIOLOGY REPORT*  Clinical Data: Shortness of breath.  Weakness.  Chest pain.  PORTABLE CHEST - 1 VIEW  Comparison: 04/18/2011  Findings: The patient is rotated to the right on today's exam, resulting in reduced diagnostic sensitivity and specificity.  Emphysema noted with atherosclerotic calcification of the aortic arch. Faint basilar interstitial accentuation appears increased from the prior exam.  Heart size is in the upper normal range.  Retrocardiac density suggests a small hiatal hernia.  Old right lower rib deformities noted laterally.  Prior right distal clavicular resection noted.  IMPRESSION:  1.  Emphysema. 2.  Faint basilar interstitial accentuation appears increased compared the prior exam, raise the possibility of interstitial edema  or atypical pneumonia. 3.  Atherosclerosis.  Original Report Authenticated By: Dellia Cloud, M.D.   Pt was Treated in ED with cardiac and respiratory medications with improvement; labs and imaging reviewed and considered in diagnostic decision making.  1. Myocardial infarct   2. Atrial flutter       MDM  The pt has tachycardia and an EKG c/w acute MI (STEMI). She is DNR (confirmed with daughter who is POA). The pt c/o only SOB; she denied CP several times. She gradually improved BP and decreased HR with the treatment given. She responded well to nebulizer medication,  with decreased SOB, that was related to fluid administration. I had extensive conversations with the daughter about treatment with respect to DNR wishes. She is comfortable with rate control and respiratory treatments.    Plan: Rx for Metoprolol and Albuterol. F/U with PCP 06/04/11.  I personally performed the services described in this documentation, which was scribed in my presence. The recorded information has been reviewed and considered.    CRITICAL CARE Performed by: Flint Melter   Total critical care time: 75  minutes  Critical care time was exclusive of separately billable procedures and treating other patients.  Critical care was necessary to treat or prevent imminent or life-threatening deterioration.  Critical care was time spent personally by me on the following activities: development of treatment plan with patient and/or surrogate as well as nursing, discussions with consultants, evaluation of patient's response to treatment, examination of patient, obtaining history from patient or surrogate, ordering and performing treatments and interventions, ordering and review of laboratory studies, ordering and review of radiographic studies, pulse oximetry and re-evaluation of patient's condition.      Flint Melter, MD 06/04/11 1744  Flint Melter, MD 06/04/11 [phone]

## 2011-06-03 NOTE — ED Notes (Signed)
CO2 of 40 reported to Dr Cy Blamer.

## 2011-06-03 NOTE — Discharge Instructions (Signed)
Make sure you see your doctor tomorrow for a checkup. Return here if needed for problems.   Acute Coronary Syndrome Acute coronary syndrome (ACS) is an urgent problem in which the blood and oxygen supply to the heart is critically deficient. ACS requires hospitalization because one or more coronary arteries may be blocked. ACS represents a range of conditions including:  Previous angina that is now unstable, lasts longer, happens at rest, or is more intense.   A heart attack, with heart muscle cell injury and death.  There are three vital coronary arteries that supply the heart muscle with blood and oxygen so that it can pump blood effectively. If blockages to these arteries develop, blood flow to the heart muscle is reduced. If the heart does not get enough blood, angina may occur as the first warning sign. SYMPTOMS   The most common signs of angina include:   Tightness or squeezing in the chest.   Feeling of heaviness on the chest.   Discomfort in the arms, neck, or jaw.   Shortness of breath and nausea.   Cold, wet skin.   Angina is usually brought on by physical effort or excitement which increase the oxygen needs of the heart. These states increase the blood flow needs of the heart beyond what can be delivered.  TREATMENT   Medicines to help discomfort may include nitroglycerin (nitro) in the form of tablets or a spray for rapid relief, or longer-acting forms such as cream, patches, or capsules. (Be aware that there are many side effects and possible interactions with other drugs).   Other medicines may be used to help the heart pump better.   Procedures to open blocked arteries including angioplasty or stent placement to keep the arteries open.   Open heart surgery may be needed when there are many blockages or they are in critical locations that are best treated with surgery.  HOME CARE INSTRUCTIONS   Avoid smoking.   Take one baby or adult aspirin daily, if your  caregiver advises. This helps reduce the risk of a heart attack.   It is very important that you follow the angina treatment prescribed by your caregiver. Make arrangements for proper follow-up care.   Eat a heart healthy diet with salt and fat restrictions as advised.   Regular exercise is good for you as long as it does not cause discomfort. Do not begin any new type of exercise until you check with your caregiver.   If you are overweight, you should lose weight.   Try to maintain normal blood lipid levels.   Keep your blood pressure under control as recommended by your caregiver.   You should tell your caregiver right away about any increase in the severity or frequency of your chest discomfort or angina attacks. When you have angina, you should stop what you are doing and sit down. This may bring relief in 3 to 5 minutes. If your caregiver has prescribed nitro, take it as directed.   If your caregiver has given you a follow-up appointment, it is very important to keep that appointment. Not keeping the appointment could result in a chronic or permanent injury, pain, and disability. If there is any problem keeping the appointment, you must call back to this facility for assistance.  SEEK IMMEDIATE MEDICAL CARE IF:   You develop nausea, vomiting, or shortness of breath.   You feel faint, lightheaded, or pass out.   Your chest discomfort gets worse.   You are sweating or  experience sudden profound fatigue.   You do not get relief of your chest pain after 3 doses of nitro.   Your discomfort lasts longer than 15 minutes.  MAKE SURE YOU:   Understand these instructions.   Will watch your condition.   Will get help right away if you are not doing well or get worse.  Document Released: 03/26/2005 Document Revised: 12/06/2010 Document Reviewed: 10/28/2007 [facility] Patient Information 2012 [place], [place].Atrial Flutter Atrial flutter is a heart rhythm that can cause the heart to beat  very fast (tachycardia). It originates in the upper chambers of the heart (atria). In atrial flutter, the top chambers of the heart (atria) often beat much faster than the bottom chambers of the heart (ventricles). Atrial flutter has a regular "saw toothed" appearance in an EKG readout. An EKG is a test that records the electrical activity of the heart. Atrial flutter can cause the heart to beat up to 150 beats per minute (BPM). Atrial flutter can either be short lived (paroxysmal) or permanent.  CAUSES  Causes of atrial flutter can be many. Some of these include:  Heart related issues:   Heart attack (myocardial infarction).   Heart failure.   Heart valve problems.   Poorly controlled high blood pressure (hypertension).   Afteropen heart surgery.   Lung related issues:   A blood clot in the lungs (pulmonary embolism).   Chronic obstructive pulmonary disease (COPD). Medications used to treat COPD can attribute to atrial flutter.   Other related causes:   Hyperthyroidism.   Caffeine.   Some decongestant cold medications.   Low electrolyte levels such as potassium or magnesium.   Cocaine.  SYMPTOMS  An awareness of your heart beating rapidly (palpitations).   Shortness of breath.   Chest pain.   Low blood pressure (hypotension).   Dizziness or fainting.  DIAGNOSIS  Different tests can be performed to diagnose atrial flutter.   An EKG.   Holter monitor. This is a 24 hour recording of your heart rhythm. You will also be given a diary. Write down all symptoms that you have and what you were doing at the time you experienced symptoms.   Cardiac event monitor. This small device can be worn for up to 30 days. When you have heart symptoms, you will push a button on the device. This will then record your heart rhythm.   Echocardiogram. This is an imaging test to look at your heart. Your caregiver will look at your heart valves and the ventricles.   Stress Test. This test  can help determine if the atrial flutter is related to exercise or if coronary artery disease is present.   Laboratory studies will look at certain blood levels like:   Complete blood count (CBC).   Potassium.   Magnesium.   Thyroid function.  TREATMENT  Treatment of atrial flutter varies. A combination of therapies may be used or sometimes atrial flutter may need only 1 type of treatment.  Lab work: If your blood work, such as your electrolytes (potassium, magnesium) or your thyroid function tests are abnormal, your caregiver will treat them accordingly.  Medication:  There are several different types of medications that can convert your heart to a normal rhythm and prevent atrial flutter from reoccurring.  Nonsurgical procedures: Nonsurgical techniques may be used to control atrial flutter. Some examples include:  Cardioversion. This technique uses either drugs or an electrical shock to restore a normal heart rhythm:   Cardioversion drugs may be given through an  intravenous (IV) line to help "reset" the heart rhythm.   In electrical cardioversion, your caregiver shocks your heart with electrical energy. This helps to reset the heartbeat to a normal rhythm.   Ablation. If atrial flutter is a persistent problem, an ablation may be needed. This procedure is done under mild sedation. High frequency radio-wave energy is used to destroy the area of heart tissue responsible for atrial flutter.  SEEK IMMEDIATE MEDICAL CARE IF:   Dizziness.   Near fainting or fainting.   Shortness of breath.   Chest pain or pressure.   Sudden nausea or vomiting.   Profuse sweating.  If you have the above symptoms, call your local emergency service immediately! Do not drive yourself to the hospital. MAKE SURE YOU:   Understand these instructions.   Will watch your condition.   Will get help right away if you are not doing well or get worse.  Document Released: 08/12/2008 Document Revised:  12/06/2010 Document Reviewed: 08/12/2008 [facility] Patient Information 2012 [place], [place].Myocardial Infarction A myocardial infarction (MI) is damage to the heart that is not reversible. It is also called a heart attack. An MI usually occurs when a heart (coronary) artery becomes blocked or narrowed. This cuts off the blood supply to the heart. When one or more of the heart (coronary) arteries becomes blocked, that area of the heart begins to die. This causes pain felt during an MI.  If you think you might be having an MI, call your local emergency services immediately (911 in [place] It is recommended that you take a 162 mg non-enteric coated aspirin if you do not have an aspirin allergy. Do not drive yourself to the hospital or wait to see if your symptoms go away. The sooner MI is treated, the greater the amount of heart muscle saved. Time is muscle. It can save your life. CAUSES  An MI can occur from:  A gradual buildup of a fatty substance called plaque. When plaque builds up in the arteries, this condition is called atherosclerosis. This buildup can block or reduce the blood supply to the heart artery(s).   A sudden plaque rupture within a heart artery that causes a blood clot (thrombus). A blood clot can block the heart artery which does not allow blood flow to the heart.   A severe tightening (spasm) of the heart artery. This is a less common cause of a heart attack. When a heart artery spasms, it cuts off blood flow through the artery. Spasms can occur in heart arteries that do not have atherosclerosis.  RISK FACTORS People at risk for an MI usually have one or more risk factors, such as:  High blood pressure.   High cholesterol.   Smoking.   Gender. Men have a higher heart attack risk.   Overweight/obesity.   Age.   Family history.   Lack of exercise.   Diabetes.   Stress.   Excessive alcohol use.   Street drug use (cocaine and methamphetamines).  SYMPTOMS  MI  symptoms can vary, such as:  In both men and women, MI symptoms can include the following:   Chest pain. The chest pain may feel like a crushing, squeezing, or "pressure" type feeling. MI pain can be "referred," meaning pain can be caused in one part of the body but felt in another part of the body. Referred MI pain may occur in the left arm, neck, or jaw. Pain may even be felt in the right arm.   Shortness of breath (  dyspnea).   Heartburn or indigestion with or without vomiting, shortness of breath, or sweating (diaphoresis).   Sudden, cold sweats.   Sudden lightheadedness.   Upper back pain.   Women can have unique MI symptoms, such as:   Unexplained feelings of nervousness or anxiety.   Discomfort between the shoulder blades (scapula) or upper back.   Tingling in the hands and arms.   In elderly people (regardless of gender), MI symptoms can be subtle, such as:   Sweating (diaphoresis).   Shortness of breath (dyspnea).   General tiredness (fatigue) or not feeling well (malaise).  DIAGNOSIS  Diagnosis of an MI involves several tests such as:  An assessment of your vital signs such as heart rhythm, blood pressure, respiratory rate, and oxygen level.   An EKG (ECG) to look at the electrical activity of your heart.   Blood tests called cardiac markers are drawn at scheduled times to measure proteins or enzymes released by the damaged heart muscle.   A chest X-ray.   An echocardiogram to evaluate heart motion and blood flow.   Coronary angiography (cardiac catheterization). This is a diagnostic procedure to look at the heart arteries.  TREATMENT  Acute Intervention. For an MI, the national standard in the [place] is to have an acute intervention in under 90 minutes from the time you get to the hospital. An acute intervention is a special procedure to open up the heart arteries. It is done in a treatment room called a "catheterization lab" (cath lab). Some hospitals  do no have a cath lab. If you are having an MI and the hospital does not have a cath lab, the standard is to transport you to a hospital that has one. In the cath lab, acute intervention includes:  Angioplasty. An angioplasty involves inserting a thin, flexible tube (catheter) into an artery in either your groin or wrist. The catheter is threaded to the heart arteries. A balloon at the end of the catheter is inflated to open a narrowed or blocked heart artery. During an angioplasty procedure, a small mesh tube (stent) may be used to keep the heart artery open. Depending on your condition and health history, one of two types of stents may be placed:   Drug-eluting stent (DES). A DES is coated with a medicine to prevent scar tissue from growing over the stent. With drug-eluting stents, blood thinning medicine will need to be taken for up to a year.   Bare metal stent. This type of stent has no special coating to keep tissue from growing over it. This type of stent is used if you cannot take blood thinning medicine for a prolonged time or you need surgery in the near future. After a bare metal stent is placed, blood thinning medicine will need to be taken for about a month.   If you are taking blood thinning medicine (anti-platelet therapy) after stent placement, do not stop taking it unless your caregiver says it is okay to do so. Make sure you understand how long you need to take the medicine.  Surgical Intervention  If an acute intervention is not successful, surgery may be needed:   Open heart surgery (coronary artery bypass graft, CABG). CABG takes a vein (saphenous vein) from your leg. The vein is then attached to the blocked heart artery which bypasses the blockage. This then allows blood flow to the heart muscle.  Additional Interventions  A "clot buster" medicine (thrombolytic) may be given. This medicine can help break  up a clot in the heart artery. This medicine may be given if a person  cannot get to a cath lab right away.   Intra-aortic balloon pump (IABP). If you have suffered a very severe MI and are too unstable to go to the cath lab or to surgery, an IABP may be used. This is a temporary mechanical device used to increase blood flow to the heart and reduce the workload of the heart until you are stable enough to go to the cath lab or surgery.  HOME CARE INSTRUCTIONS After an MI, you may need the following:  Medication. Take medication as directed by your caregiver. Medications after an MI may:   Keep your blood from clotting easily (blood thinners).   Control your blood pressure.   Help lower your cholesterol.   Control abnormal heart rhythms.   Lifestyle changes. Under the guidance of your caregiver, lifestyle changes include:   Quitting smoking, if you smoke. Your caregiver can help you quit.   Being physically active.   Maintaining a healthy weight.   Eating a heart healthy diet. A dietician can help you learn healthy eating options.   Managing diabetes.   Reducing stress.   Limiting alcohol intake.  SEEK IMMEDIATE MEDICAL CARE IF:   You have severe chest pain, especially if the pain is crushing or pressure-like and spreads to the arms, back, neck, or jaw. This is an emergency. Do not wait to see if the pain will go away. Get medical help at once. Call your local emergency services (911 in the [place] Do not drive yourself to the hospital.   You have shortness of breath during rest, sleep, or with activity.   You have sudden sweating or clammy skin.   You feel sick to your stomach (nauseous) and throw up (vomit).   You suddenly become lightheaded or dizzy.   You feel your heart beating rapidly or you notice "skipped" beats.  MAKE SURE YOU:   Understand these instructions.   Will watch your condition.   Will get help right away if you are not doing well or get worse.  Document Released: 03/26/2005 Document Revised: 12/06/2010 Document  Reviewed: 08/23/2010 [facility] Patient Information 2012 [place], [place].

## 2011-06-03 NOTE — ED Notes (Signed)
Cardizem and Lopressor held per Dr Cy Blamer due to BP. Fluid bolus given per orders.

## 2011-06-03 NOTE — ED Notes (Signed)
Notified [facility] of pt D/C. Staff member states she will have someone come to get pt.

## 2011-06-05 LAB — URINE CULTURE: Culture  Setup Time: 201302242020

## 2011-08-30 ENCOUNTER — Ambulatory Visit (HOSPITAL_COMMUNITY)
Admission: RE | Admit: 2011-08-30 | Discharge: 2011-08-30 | Disposition: A | Discharge status: Home / Self Care | Payer: Medicare Other | Source: Ambulatory Visit | Attending: Internal Medicine | Admitting: Internal Medicine

## 2011-08-30 ENCOUNTER — Other Ambulatory Visit (HOSPITAL_BASED_OUTPATIENT_CLINIC_OR_DEPARTMENT_OTHER): Payer: Self-pay | Admitting: Internal Medicine

## 2011-08-30 DIAGNOSIS — R059 Cough, unspecified: Secondary | ICD-10-CM | POA: Insufficient documentation

## 2011-08-30 DIAGNOSIS — M4120 Other idiopathic scoliosis, site unspecified: Secondary | ICD-10-CM | POA: Insufficient documentation

## 2011-08-30 DIAGNOSIS — R05 Cough: Secondary | ICD-10-CM | POA: Insufficient documentation

## 2011-08-30 DIAGNOSIS — J4489 Other specified chronic obstructive pulmonary disease: Secondary | ICD-10-CM | POA: Insufficient documentation

## 2011-08-30 DIAGNOSIS — J449 Chronic obstructive pulmonary disease, unspecified: Secondary | ICD-10-CM | POA: Insufficient documentation

## 2011-08-30 DIAGNOSIS — M899 Disorder of bone, unspecified: Secondary | ICD-10-CM | POA: Insufficient documentation

## 2011-08-30 DIAGNOSIS — R0989 Other specified symptoms and signs involving the circulatory and respiratory systems: Secondary | ICD-10-CM | POA: Insufficient documentation

## 2011-08-30 DIAGNOSIS — J984 Other disorders of lung: Secondary | ICD-10-CM | POA: Insufficient documentation

## 2011-09-04 ENCOUNTER — Ambulatory Visit (HOSPITAL_COMMUNITY)
Admission: RE | Admit: 2011-09-04 | Discharge: 2011-09-04 | Disposition: A | Discharge status: Home / Self Care | Payer: Medicare Other | Source: Ambulatory Visit | Attending: Internal Medicine | Admitting: Internal Medicine

## 2011-09-04 DIAGNOSIS — R059 Cough, unspecified: Secondary | ICD-10-CM | POA: Insufficient documentation

## 2011-09-04 DIAGNOSIS — R0602 Shortness of breath: Secondary | ICD-10-CM | POA: Insufficient documentation

## 2011-09-04 DIAGNOSIS — R05 Cough: Secondary | ICD-10-CM | POA: Insufficient documentation

## 2011-09-04 LAB — BLOOD GAS, ARTERIAL
Acid-Base Excess: 12.4 mmol/L — ABNORMAL HIGH (ref 0.0–2.0)
Bicarbonate: 37.5 mEq/L — ABNORMAL HIGH (ref 20.0–24.0)
O2 Content: 2 L/min
O2 Saturation: 96.8 %
TCO2: 34.6 mmol/L (ref 0–100)
pCO2 arterial: 59 mmHg (ref 35.0–45.0)
pO2, Arterial: 86 mmHg (ref 80.0–100.0)

## 2011-10-10 ENCOUNTER — Other Ambulatory Visit (HOSPITAL_BASED_OUTPATIENT_CLINIC_OR_DEPARTMENT_OTHER): Payer: Self-pay | Admitting: Internal Medicine

## 2011-10-10 ENCOUNTER — Ambulatory Visit (HOSPITAL_COMMUNITY)
Admission: RE | Admit: 2011-10-10 | Discharge: 2011-10-10 | Disposition: A | Discharge status: Home / Self Care | Payer: Medicare Other | Source: Ambulatory Visit | Attending: Internal Medicine | Admitting: Internal Medicine

## 2011-10-10 DIAGNOSIS — K8020 Calculus of gallbladder without cholecystitis without obstruction: Secondary | ICD-10-CM | POA: Insufficient documentation

## 2011-10-10 DIAGNOSIS — I714 Abdominal aortic aneurysm, without rupture, unspecified: Secondary | ICD-10-CM | POA: Insufficient documentation

## 2011-10-10 DIAGNOSIS — Z87442 Personal history of urinary calculi: Secondary | ICD-10-CM | POA: Insufficient documentation

## 2011-10-10 DIAGNOSIS — R112 Nausea with vomiting, unspecified: Secondary | ICD-10-CM

## 2011-10-10 DIAGNOSIS — Z9089 Acquired absence of other organs: Secondary | ICD-10-CM | POA: Insufficient documentation

## 2011-10-15 ENCOUNTER — Other Ambulatory Visit (HOSPITAL_BASED_OUTPATIENT_CLINIC_OR_DEPARTMENT_OTHER): Payer: Self-pay | Admitting: Internal Medicine

## 2011-10-15 DIAGNOSIS — R1110 Vomiting, unspecified: Secondary | ICD-10-CM

## 2011-10-16 ENCOUNTER — Encounter (HOSPITAL_COMMUNITY): Payer: Self-pay

## 2011-10-16 ENCOUNTER — Encounter (HOSPITAL_COMMUNITY)
Admission: RE | Admit: 2011-10-16 | Discharge: 2011-10-16 | Disposition: A | Discharge status: Home / Self Care | Payer: Medicare Other | Source: Ambulatory Visit | Attending: Internal Medicine | Admitting: Internal Medicine

## 2011-10-16 DIAGNOSIS — R1110 Vomiting, unspecified: Secondary | ICD-10-CM | POA: Insufficient documentation

## 2011-10-16 MED ORDER — TECHNETIUM TC 99M MEBROFENIN IV KIT
5.0000 | PACK | Freq: Once | INTRAVENOUS | Status: AC | PRN
  Administered 2011-10-16: 5.1 via INTRAVENOUS

## 2011-10-16 MED ORDER — MORPHINE SULFATE 2 MG/ML IJ SOLN: 2.0000 mg | Freq: Once | INTRAMUSCULAR | Status: DC

## 2011-12-06 ENCOUNTER — Other Ambulatory Visit: Payer: Self-pay | Admitting: Internal Medicine

## 2011-12-06 ENCOUNTER — Ambulatory Visit (HOSPITAL_COMMUNITY)
Admission: RE | Admit: 2011-12-06 | Discharge: 2011-12-06 | Disposition: A | Discharge status: Home / Self Care | Payer: Medicare Other | Source: Ambulatory Visit | Attending: Internal Medicine | Admitting: Internal Medicine

## 2011-12-06 DIAGNOSIS — M25552 Pain in left hip: Secondary | ICD-10-CM

## 2011-12-06 DIAGNOSIS — R05 Cough: Secondary | ICD-10-CM | POA: Insufficient documentation

## 2011-12-06 DIAGNOSIS — R059 Cough, unspecified: Secondary | ICD-10-CM | POA: Insufficient documentation

## 2011-12-06 DIAGNOSIS — W19XXXA Unspecified fall, initial encounter: Secondary | ICD-10-CM

## 2011-12-06 DIAGNOSIS — M25559 Pain in unspecified hip: Secondary | ICD-10-CM

## 2011-12-06 DIAGNOSIS — J438 Other emphysema: Secondary | ICD-10-CM | POA: Insufficient documentation

## 2011-12-06 DIAGNOSIS — Z9181 History of falling: Secondary | ICD-10-CM | POA: Insufficient documentation

## 2011-12-06 DIAGNOSIS — M898X5 Other specified disorders of bone, thigh: Secondary | ICD-10-CM

## 2012-01-23 ENCOUNTER — Other Ambulatory Visit (HOSPITAL_BASED_OUTPATIENT_CLINIC_OR_DEPARTMENT_OTHER): Payer: Self-pay | Admitting: Internal Medicine

## 2012-01-23 ENCOUNTER — Ambulatory Visit (HOSPITAL_COMMUNITY)
Admission: RE | Admit: 2012-01-23 | Discharge: 2012-01-23 | Disposition: A | Discharge status: Home / Self Care | Payer: Medicare Other | Source: Ambulatory Visit | Attending: Internal Medicine | Admitting: Internal Medicine

## 2012-01-23 DIAGNOSIS — R0989 Other specified symptoms and signs involving the circulatory and respiratory systems: Secondary | ICD-10-CM | POA: Insufficient documentation

## 2012-01-23 DIAGNOSIS — I2510 Atherosclerotic heart disease of native coronary artery without angina pectoris: Secondary | ICD-10-CM | POA: Insufficient documentation

## 2012-01-23 DIAGNOSIS — K449 Diaphragmatic hernia without obstruction or gangrene: Secondary | ICD-10-CM | POA: Insufficient documentation

## 2012-01-23 DIAGNOSIS — R059 Cough, unspecified: Secondary | ICD-10-CM

## 2012-01-23 DIAGNOSIS — R05 Cough: Secondary | ICD-10-CM

## 2012-01-23 DIAGNOSIS — Z87891 Personal history of nicotine dependence: Secondary | ICD-10-CM | POA: Insufficient documentation

## 2012-02-13 IMAGING — CR DG RIBS W/ CHEST 3+V*R*
5 series · 5 of 5 positions shown · non-contrast
Comparison: Chest radiograph 12/08/2009

CLINICAL DATA: Right rib pain post fall landing on right side

RIGHT RIBS AND CHEST - 3+ VIEW

[view not recorded (1 of 5)]
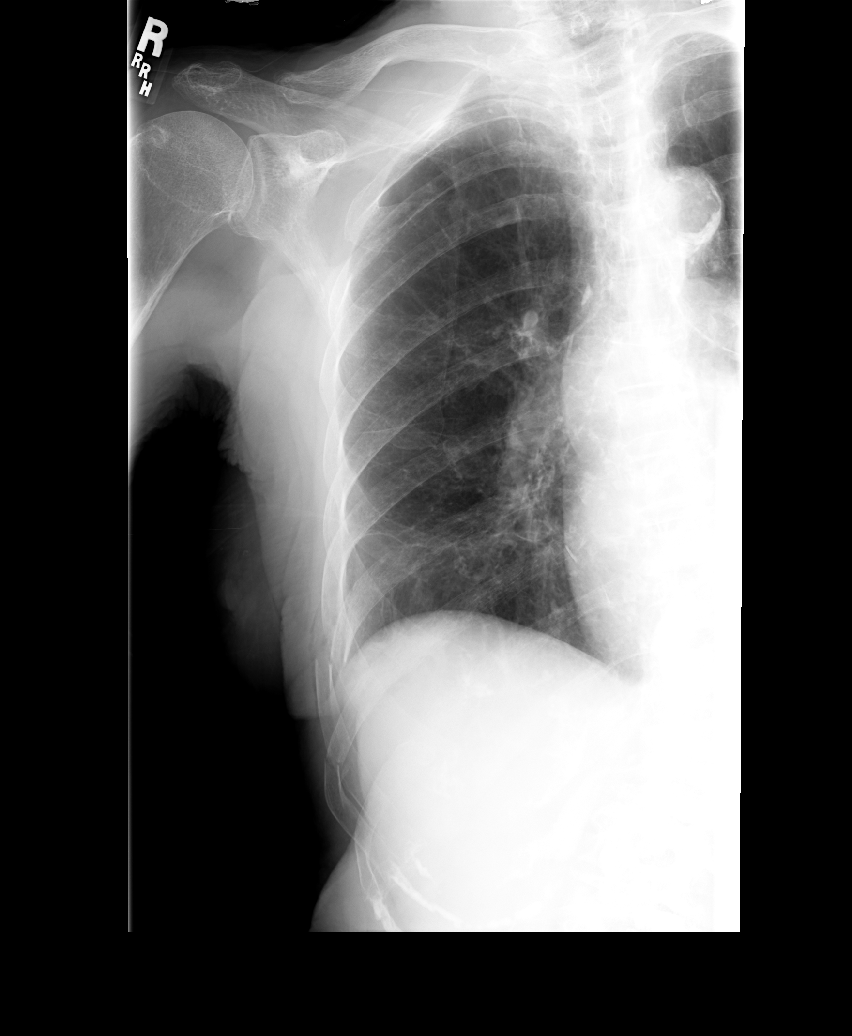

[view not recorded (2 of 5)]
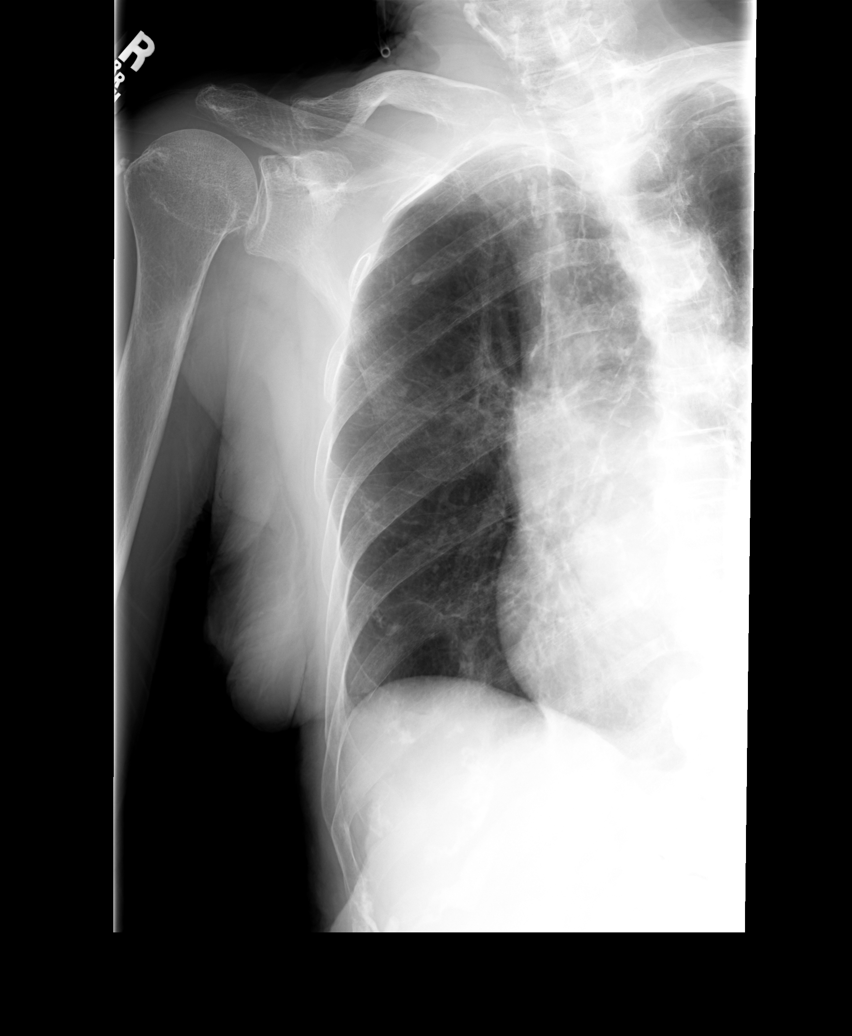

[view not recorded (3 of 5)]
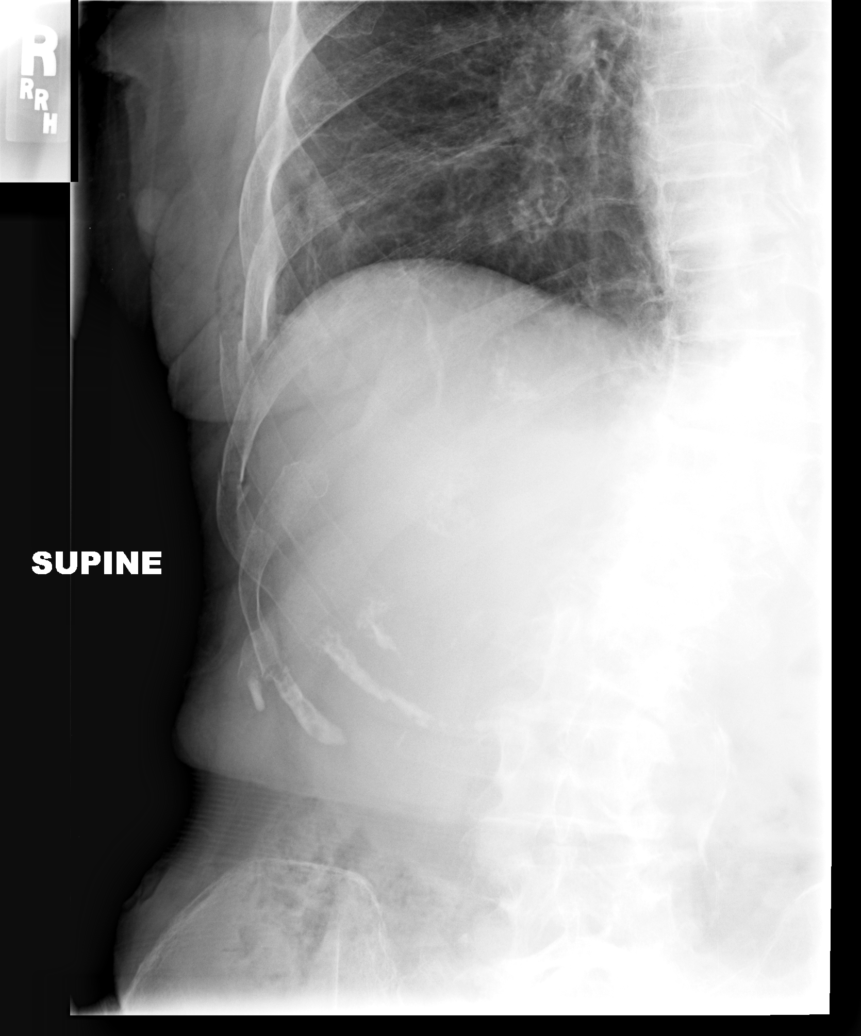

[view not recorded (4 of 5)]
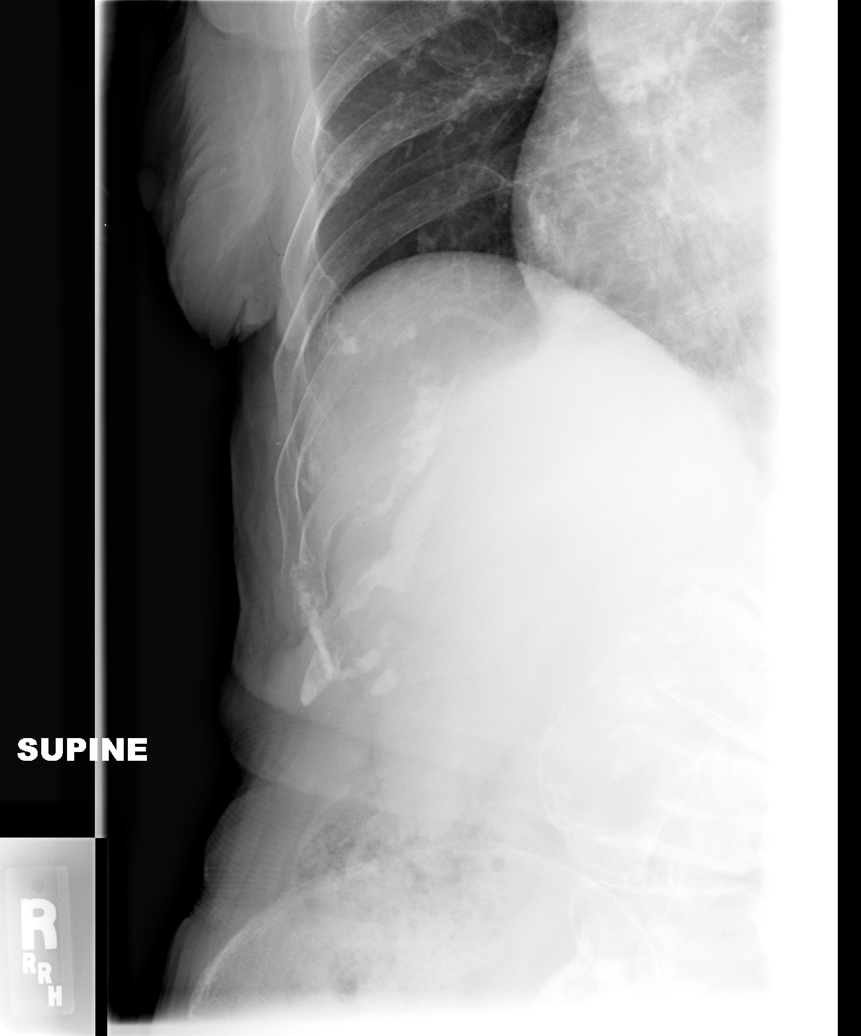

[view not recorded (5 of 5)]
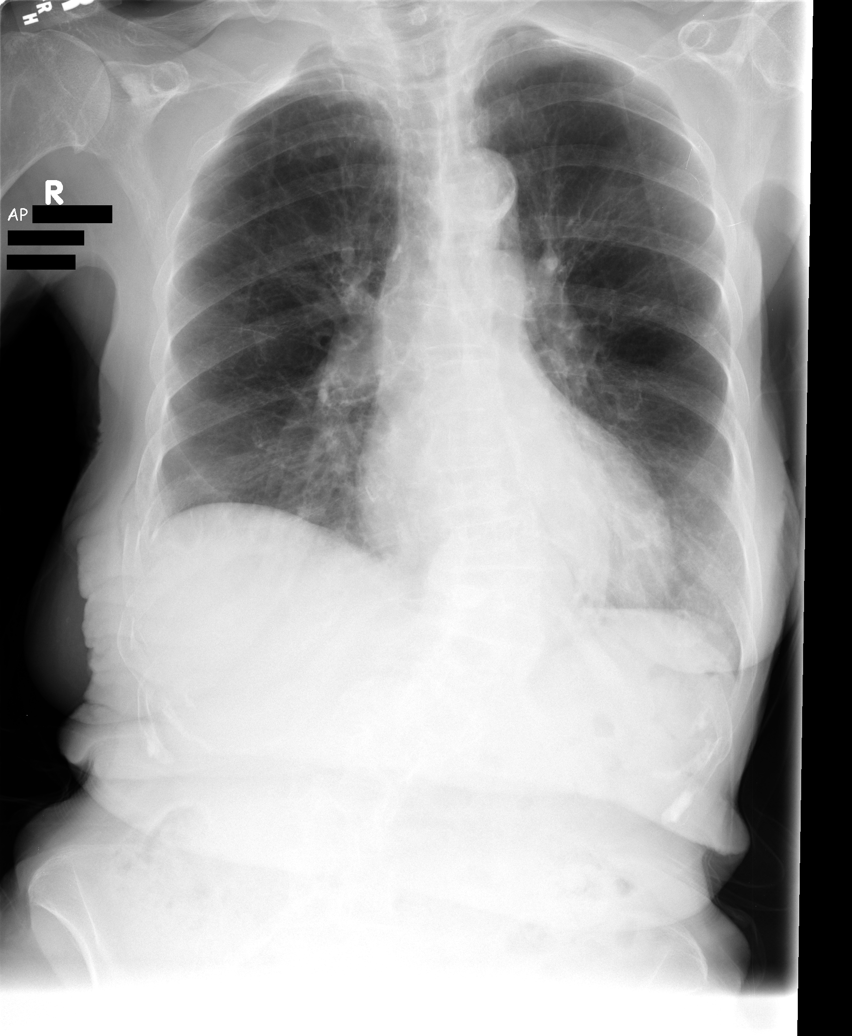

[5 of 5 positions shown; findings below may reference images not displayed]

FINDINGS: Enlargement of cardiac silhouette.
Calcified tortuous thoracic aorta.
Question small hiatal hernia.
Pulmonary vascularity normal.
Emphysematous and bronchitic changes.
No acute infiltrate, effusion, or pneumothorax.
Diffuse osseous demineralization.
Displaced fractures lateral right eighth, ninth, and suspect tenth
ribs.
Thoracolumbar scoliosis.
IMPRESSION: Enlargement of cardiac silhouette.
Question small hiatal hernia.
Emphysematous and bronchitic changes.
Displaced fractures right eighth, ninth, and likely tenth ribs.

## 2012-03-20 ENCOUNTER — Ambulatory Visit (HOSPITAL_COMMUNITY)
Admission: RE | Admit: 2012-03-20 | Discharge: 2012-03-20 | Disposition: A | Discharge status: Home / Self Care | Payer: Medicare Other | Source: Ambulatory Visit | Attending: Internal Medicine | Admitting: Internal Medicine

## 2012-03-20 ENCOUNTER — Other Ambulatory Visit (HOSPITAL_BASED_OUTPATIENT_CLINIC_OR_DEPARTMENT_OTHER): Payer: Self-pay | Admitting: Internal Medicine

## 2012-03-20 DIAGNOSIS — R062 Wheezing: Secondary | ICD-10-CM

## 2012-03-20 DIAGNOSIS — J449 Chronic obstructive pulmonary disease, unspecified: Secondary | ICD-10-CM

## 2012-03-20 DIAGNOSIS — J438 Other emphysema: Secondary | ICD-10-CM | POA: Insufficient documentation

## 2012-03-20 DIAGNOSIS — R0602 Shortness of breath: Secondary | ICD-10-CM | POA: Insufficient documentation

## 2012-04-01 ENCOUNTER — Other Ambulatory Visit: Payer: Self-pay | Admitting: Internal Medicine

## 2012-04-01 ENCOUNTER — Ambulatory Visit (HOSPITAL_COMMUNITY)
Admission: RE | Admit: 2012-04-01 | Discharge: 2012-04-01 | Disposition: A | Discharge status: Home / Self Care | Payer: Medicare Other | Source: Ambulatory Visit | Attending: Internal Medicine | Admitting: Internal Medicine

## 2012-04-01 ENCOUNTER — Other Ambulatory Visit (HOSPITAL_BASED_OUTPATIENT_CLINIC_OR_DEPARTMENT_OTHER): Payer: Self-pay | Admitting: Internal Medicine

## 2012-04-01 DIAGNOSIS — W19XXXA Unspecified fall, initial encounter: Secondary | ICD-10-CM | POA: Insufficient documentation

## 2012-04-01 DIAGNOSIS — R52 Pain, unspecified: Secondary | ICD-10-CM

## 2012-04-01 DIAGNOSIS — R109 Unspecified abdominal pain: Secondary | ICD-10-CM | POA: Insufficient documentation

## 2012-04-11 ENCOUNTER — Ambulatory Visit (HOSPITAL_COMMUNITY)
Admission: RE | Admit: 2012-04-11 | Discharge: 2012-04-11 | Disposition: A | Discharge status: Home / Self Care | Payer: Medicare Other | Source: Ambulatory Visit | Attending: Internal Medicine | Admitting: Internal Medicine

## 2012-04-11 ENCOUNTER — Other Ambulatory Visit (HOSPITAL_BASED_OUTPATIENT_CLINIC_OR_DEPARTMENT_OTHER): Payer: Self-pay | Admitting: Internal Medicine

## 2012-04-11 DIAGNOSIS — R05 Cough: Secondary | ICD-10-CM

## 2012-04-11 DIAGNOSIS — R059 Cough, unspecified: Secondary | ICD-10-CM | POA: Insufficient documentation

## 2012-04-11 DIAGNOSIS — J3489 Other specified disorders of nose and nasal sinuses: Secondary | ICD-10-CM | POA: Insufficient documentation

## 2012-04-30 ENCOUNTER — Inpatient Hospital Stay
Admission: RE | Admit: 2012-04-30 | Discharge: 2013-05-10 | Disposition: E | Payer: PRIVATE HEALTH INSURANCE | Source: Ambulatory Visit | Attending: Internal Medicine | Admitting: Internal Medicine

## 2012-04-30 DIAGNOSIS — R0989 Other specified symptoms and signs involving the circulatory and respiratory systems: Secondary | ICD-10-CM

## 2012-04-30 DIAGNOSIS — J449 Chronic obstructive pulmonary disease, unspecified: Secondary | ICD-10-CM

## 2012-04-30 DIAGNOSIS — J189 Pneumonia, unspecified organism: Secondary | ICD-10-CM

## 2012-04-30 DIAGNOSIS — R05 Cough: Principal | ICD-10-CM

## 2012-04-30 DIAGNOSIS — I714 Abdominal aortic aneurysm, without rupture: Secondary | ICD-10-CM

## 2012-04-30 DIAGNOSIS — R52 Pain, unspecified: Secondary | ICD-10-CM

## 2012-04-30 DIAGNOSIS — R062 Wheezing: Secondary | ICD-10-CM

## 2012-05-10 DEATH — deceased

## 2012-05-14 ENCOUNTER — Ambulatory Visit (HOSPITAL_COMMUNITY)
Admit: 2012-05-14 | Discharge: 2012-05-14 | Disposition: A | Discharge status: Home / Self Care | Payer: Medicare Other | Attending: Internal Medicine | Admitting: Internal Medicine

## 2012-05-14 DIAGNOSIS — J438 Other emphysema: Secondary | ICD-10-CM | POA: Insufficient documentation

## 2012-05-14 DIAGNOSIS — R05 Cough: Secondary | ICD-10-CM | POA: Insufficient documentation

## 2012-05-14 DIAGNOSIS — R059 Cough, unspecified: Secondary | ICD-10-CM | POA: Insufficient documentation

## 2012-05-14 DIAGNOSIS — S32509A Unspecified fracture of unspecified pubis, initial encounter for closed fracture: Secondary | ICD-10-CM | POA: Insufficient documentation

## 2012-05-14 DIAGNOSIS — K449 Diaphragmatic hernia without obstruction or gangrene: Secondary | ICD-10-CM | POA: Insufficient documentation

## 2012-05-14 DIAGNOSIS — X58XXXA Exposure to other specified factors, initial encounter: Secondary | ICD-10-CM | POA: Insufficient documentation

## 2012-06-27 ENCOUNTER — Encounter: Payer: Self-pay | Admitting: Internal Medicine

## 2012-06-27 ENCOUNTER — Ambulatory Visit (HOSPITAL_COMMUNITY): Payer: Medicare Other | Attending: Internal Medicine

## 2012-06-27 ENCOUNTER — Other Ambulatory Visit: Payer: Self-pay | Admitting: Geriatric Medicine

## 2012-06-27 ENCOUNTER — Non-Acute Institutional Stay (SKILLED_NURSING_FACILITY): Payer: Medicare Other | Admitting: Internal Medicine

## 2012-06-27 DIAGNOSIS — I700 Atherosclerosis of aorta: Secondary | ICD-10-CM | POA: Insufficient documentation

## 2012-06-27 DIAGNOSIS — R609 Edema, unspecified: Secondary | ICD-10-CM

## 2012-06-27 DIAGNOSIS — J029 Acute pharyngitis, unspecified: Secondary | ICD-10-CM | POA: Insufficient documentation

## 2012-06-27 DIAGNOSIS — I4891 Unspecified atrial fibrillation: Secondary | ICD-10-CM

## 2012-06-27 DIAGNOSIS — I714 Abdominal aortic aneurysm, without rupture, unspecified: Secondary | ICD-10-CM | POA: Insufficient documentation

## 2012-06-27 DIAGNOSIS — D649 Anemia, unspecified: Secondary | ICD-10-CM | POA: Insufficient documentation

## 2012-06-27 DIAGNOSIS — F411 Generalized anxiety disorder: Secondary | ICD-10-CM | POA: Insufficient documentation

## 2012-06-27 DIAGNOSIS — J441 Chronic obstructive pulmonary disease with (acute) exacerbation: Secondary | ICD-10-CM | POA: Insufficient documentation

## 2012-06-27 DIAGNOSIS — E039 Hypothyroidism, unspecified: Secondary | ICD-10-CM | POA: Insufficient documentation

## 2012-06-27 DIAGNOSIS — R0989 Other specified symptoms and signs involving the circulatory and respiratory systems: Secondary | ICD-10-CM | POA: Insufficient documentation

## 2012-06-27 DIAGNOSIS — I509 Heart failure, unspecified: Secondary | ICD-10-CM | POA: Insufficient documentation

## 2012-06-27 DIAGNOSIS — I6529 Occlusion and stenosis of unspecified carotid artery: Secondary | ICD-10-CM | POA: Insufficient documentation

## 2012-06-27 DIAGNOSIS — R062 Wheezing: Secondary | ICD-10-CM | POA: Insufficient documentation

## 2012-06-27 MED ORDER — ZOLPIDEM TARTRATE 5 MG PO TABS: 5.0000 mg | ORAL_TABLET | Freq: Every evening | ORAL | Status: AC | PRN

## 2012-06-27 NOTE — Progress Notes (Signed)
Patient ID: Melanie [name], female   DOB: 04/22/1925, 79 y.o.   MRN: [id number] Chief complaint-medical management of COPD CHF hypothyroidism anxiety.  History of present illness.  Patient is a pleasant elderly resident with the above diagnoses she continues to be quite stable.  Pali earlier today she complained of some increased cough shortness of breath-chest x-ray was done which showed no acute changes-she appears to be stable this evening denies shortness of breath greater than baseline -- says she does have a sore throat at time   s she is on routine Xanax for anxiety and this has been stable for some time as well.    Marland Kitchen Her weight continues to be stable.  Regards to respiratory issues she is stable on nebulizer   she was diagnosed with bilateral pelvic fractures apparently after a fall-she has recovered nicely from this.  She does have some history of tachycardia-this is controlled on Lopressor low dose twice a day-she had been on diltiazem but that was discontinued-history of atrial fibrillation she also continues on amiodarone.  Family medical social history has been reviewed.  Medications been reviewed.  Review of systems.  In general she's denying any fever or chills denied.  Skin-denies any acute issues.  Oropharynx-does complain of sore throat denies any increased difficulty swallowing however.  Respiratory significant history of COPD-denies shortness of breath greater than baseline this evening-does have an occasional cough.  Cardiac-denies chest pain or palpitations that she does not have any lower or edema.  GI-appetite apparently is fairly stable she denies any abdominal pain nausea or vomiting constipation or diarrhea.  Muscle skeletal-not complain of joint pain has complained of some hip pain in the past however.  Neurologic denies any headache dizziness or syncopal episodes this evening.  Psych significant history of anxiety-this is stable for a  while.  Physical exam.  Is a frail elderly female in no distress lying comfortable in bed.  Skin is warm and dry. No  Oropharynx is clear mucous membranes moist they do not note any increased erythema.  Eyes pupils equal round reactive to light sclerae and conjunctivae are clear  Chest decreased breath sounds diffuse slight wheezing at the bases-no labored breathing this is essentially baseline exam.  Heart-distant heart sounds-radial pulses regular rate and rhythm.  Abdomen soft nontender somewhat protuberant positive bowel sounds.  Muscle skeletal-was extremities x4 usually ambulates in a wheelchair-disabled to stand and pivot.  Neurologic grossly intact speech is clear no lateralizing findings noted.  Psych continues to be pleasant appropriate laughing  .  .  Labs.  05/05/2012.  WBC 10.4 hemoglobin 10.2 platelets 2:15.  Sodium 142 potassium 4.2 BUN 22 creatinine 0.71.  Liver function tests within normal limits except albumin of 3.0.  04/07/2012.  WUJ-8.119.  Assessment and plan.  #1-pharyngitis-Will give Chloraseptic spray when necessary and monitor.  #2 COPD- appear stable-recent x-ray did not show any acute process continues on nebulizers as well asSpiriva and Pulmicort--on chronic oxygen.  #3 hypothyroidism-this is stable on Synthroid-recent TSH within normal limits.  #4 atrial fibrillation-this appears rate controlled on amiodarone and low dose Lopressor-on aspirin for anticoagulation.  #5 history of abdominal aortic aneurysm-stable per recent ultrasound July 2013.  #6-anemia-this has been stable per recent CBCs.  #7-anxiety-stable on Xanax 4 times a day.  #8-he does have a history of edema but this really does not appear to be an issue here for some time she is on Lasix--low dose  . #9-cough-does have Robitussin when necessary--vital signs as well as  O2 saturation have been stable

## 2012-07-07 ENCOUNTER — Other Ambulatory Visit: Payer: Self-pay | Admitting: *Deleted

## 2012-07-07 MED ORDER — ALPRAZOLAM 1 MG PO TABS: ORAL_TABLET | ORAL | Status: DC

## 2012-09-02 ENCOUNTER — Non-Acute Institutional Stay (SKILLED_NURSING_FACILITY): Payer: Medicare Other | Admitting: Internal Medicine

## 2012-09-02 DIAGNOSIS — J441 Chronic obstructive pulmonary disease with (acute) exacerbation: Secondary | ICD-10-CM

## 2012-09-02 DIAGNOSIS — R609 Edema, unspecified: Secondary | ICD-10-CM

## 2012-09-02 DIAGNOSIS — E039 Hypothyroidism, unspecified: Secondary | ICD-10-CM

## 2012-09-02 DIAGNOSIS — R109 Unspecified abdominal pain: Secondary | ICD-10-CM

## 2012-09-02 DIAGNOSIS — I4891 Unspecified atrial fibrillation: Secondary | ICD-10-CM

## 2012-09-02 DIAGNOSIS — I714 Abdominal aortic aneurysm, without rupture: Secondary | ICD-10-CM

## 2012-09-02 DIAGNOSIS — F411 Generalized anxiety disorder: Secondary | ICD-10-CM

## 2012-09-02 DIAGNOSIS — D649 Anemia, unspecified: Secondary | ICD-10-CM

## 2012-09-02 NOTE — Progress Notes (Signed)
Patient ID: Melanie [name], female   DOB: Sep 03, 1925, 79 y.o.   MRN: [id number] This is an acute visit.  Facility [facility].  Level of care skilled        Chief complaint-medical management of COPD CHF hypothyroidism anxiety--acute visit secondary to stomach discomfort.   History of present illness.   Patient is a pleasant elderly resident to be quite stable despite her fragile status.  She is complaining of some stomach pain tonight she says this has been going on for about 3 weeks and it's irritating her.  She says it has affected her appetite.  However her weight is stable she's actually gained about 3 pounds the past month  Per chart review she did have an abdominal ultrasound in August of last year that did not show any acute process it did show asymptomatic gallstones and an abdominal aortic aneurysm of 4.3 cm in diameter.  A CT scan back in 2012 essentially showed similar findings  She denies any nausea or vomiting or associated chest pain         s she is on routine Xanax for anxiety and this has been stable for some time as well.     Regards to respiratory issues she is stable on nebulizer     she was diagnosed with bilateral pelvic fractures apparently after a fall-she has recovered nicely from this.   She does have some history of tachycardia-this is controlled on Lopressor low dose twice a day-she had been on diltiazem but that was discontinued-history of atrial fibrillation she also continues on amiodarone.   Family medical social history has been reviewed.  per history and physical on 10/19/2009   Medications been reviewedper MAR.   Review of systems.   In general she's denying any fever or chills denied.   Skin-denies any acute issues.   Oropharynx-does  not complain of sore throat denies any increased difficulty swallowingr.   Respiratory significant history of COPD-denies shortness of breath greater than baseline this evening-does have  an occasional cough.   Cardiac-denies chest pain or palpitations that she does not have any lower or edema.   GI- Complaint of stomach pain x3 weeks says it has affected her appetite--cannot really localize it   Muscle skeletal-not complain of joint pain has complained of some hip pain in the past however.   Neurologic denies any headache dizziness or syncopal episodes this evening.   Psych significant history of anxiety-this is stable for a while.   Physical exam.  T-. 98 7 pulse 68 respirations 20 blood pressure 118/64--weight is 100 she's gained about 3 pounds the past month   Is a frail elderly female in no distress sitting in her wheelchair.   Skin is warm and dry.o   Oropharynx is clear mucous membranes moist tdo not note any increased erythema.   Eyes pupils equal round reactive to light sclerae and conjunctivae are clear   Chest decreased breath sounds diffuse slight wheezing at the bases-no labored breathing this is essentially baseline exam.   Heart-distant heart sounds-radial pulses regular rate and rhythm.   Abdomen soft  diffusely tender but not acutely  somewhat protuberant positive bowel sounds.   Muscle skeletal-was extremities x4 usually ambulates in a wheelchair no deformities noted.   Neurologic grossly intact speech is clear no lateralizing findings noted.   Psych continues to be pleasant appropriate  .   .   Labs.    05/05/2012.   WBC 10.4 hemoglobin 10.2 platelets 2:15.   Sodium  142 potassium 4.2 BUN 22 creatinine 0.71.   Liver function tests within normal limits except albumin of 3.0.   04/07/2012.   RUE-4.540.   Assessment and plan.   #1-abdominal pain-unclear etiology-we'll obtain lab work including a comprehensive metabolic panel amylase and lipase as well as a CBC with differential--.   #2 COPD- appear stable-recent x-ray did not show any acute process continues on nebulizers as well asSpiriva and  Pulmicort--on chronic oxygen.   #3 hypothyroidism-on Synthroid recent TSH within normal limits.   #4 atrial fibrillation-this appears rate controlled on amiodarone and low dose Lopressor-on aspirin for anticoagulation.   #5 history of abdominal aortic aneurysm-stable per recent ultrasound July 2013.   #6-anemia-this has been stable per recent CBCs.will update   #7-anxiety-stable on Xanax 4 times a day.   #8- does have a history of edema but this really does not appear to be an issue here for some time she is on Lasix--low doseupdate CMP  CPT [phone]

## 2012-10-20 ENCOUNTER — Ambulatory Visit (HOSPITAL_COMMUNITY): Payer: Medicare Other | Attending: Internal Medicine

## 2012-10-20 DIAGNOSIS — J4489 Other specified chronic obstructive pulmonary disease: Secondary | ICD-10-CM | POA: Insufficient documentation

## 2012-10-20 DIAGNOSIS — R05 Cough: Secondary | ICD-10-CM | POA: Insufficient documentation

## 2012-10-20 DIAGNOSIS — R0602 Shortness of breath: Secondary | ICD-10-CM | POA: Insufficient documentation

## 2012-10-20 DIAGNOSIS — R059 Cough, unspecified: Secondary | ICD-10-CM | POA: Insufficient documentation

## 2012-10-20 DIAGNOSIS — K449 Diaphragmatic hernia without obstruction or gangrene: Secondary | ICD-10-CM | POA: Insufficient documentation

## 2012-10-20 DIAGNOSIS — J449 Chronic obstructive pulmonary disease, unspecified: Secondary | ICD-10-CM | POA: Insufficient documentation

## 2012-10-23 ENCOUNTER — Non-Acute Institutional Stay (SKILLED_NURSING_FACILITY): Payer: Medicare Other | Admitting: Internal Medicine

## 2012-10-23 DIAGNOSIS — T148XXA Other injury of unspecified body region, initial encounter: Secondary | ICD-10-CM

## 2012-10-23 DIAGNOSIS — E039 Hypothyroidism, unspecified: Secondary | ICD-10-CM

## 2012-10-23 DIAGNOSIS — J189 Pneumonia, unspecified organism: Secondary | ICD-10-CM

## 2012-10-23 DIAGNOSIS — F411 Generalized anxiety disorder: Secondary | ICD-10-CM

## 2012-10-23 DIAGNOSIS — I4891 Unspecified atrial fibrillation: Secondary | ICD-10-CM

## 2012-10-23 DIAGNOSIS — R531 Weakness: Secondary | ICD-10-CM

## 2012-10-23 DIAGNOSIS — R5381 Other malaise: Secondary | ICD-10-CM

## 2012-10-23 DIAGNOSIS — J441 Chronic obstructive pulmonary disease with (acute) exacerbation: Secondary | ICD-10-CM

## 2012-10-23 DIAGNOSIS — I714 Abdominal aortic aneurysm, without rupture: Secondary | ICD-10-CM

## 2012-10-23 DIAGNOSIS — R609 Edema, unspecified: Secondary | ICD-10-CM

## 2012-10-23 DIAGNOSIS — D649 Anemia, unspecified: Secondary | ICD-10-CM

## 2012-10-28 ENCOUNTER — Ambulatory Visit (HOSPITAL_COMMUNITY)
Admit: 2012-10-28 | Discharge: 2012-10-28 | Disposition: A | Discharge status: Home / Self Care | Payer: Medicare Other | Source: Ambulatory Visit | Attending: Internal Medicine | Admitting: Internal Medicine

## 2012-10-28 DIAGNOSIS — I714 Abdominal aortic aneurysm, without rupture, unspecified: Secondary | ICD-10-CM | POA: Insufficient documentation

## 2012-11-01 NOTE — Progress Notes (Signed)
Patient ID: Melanie [name], female   DOB: 03-22-1926, 79 y.o.   MRN: [id number] This is an acute visit.  Facility [facility].  Level of care skilled   Chief complaint-medical management of COPD CHF hypothyroidism anxiety--acute visit secondary to chest congestion --suspected pneumonia .  History of present illness.  Patient is a pleasant elderly resident to be quite stable most of the time   despite her fragile status.   He does have a history of COPD oxygen dependent and continues on Spiriva as well as routine nebulizers and Pulmicort.  She has developed some increased chest congestion and says she just doesn't feel very well Nursing staff reports she is not herself lying in bed more-she tends to have this presentation when she is having acute respiratory issues She does complain of shortness of breath-her O2 saturation continues to be in the 90s on chronic oxygen Chest x-ray was obtained which is suspicious for a mild right lower lobe pneumonia I suspect complicated with her history of COPD she has been started on Avelox  Her other issues appear to be stable  s she is on routine Xanax for anxiety and this has been stable for some time as well.   she was diagnosed with bilateral pelvic fractures  Diagnosed  after a fall-she has recovered nicely from this.  She does have some history of tachycardia-this is controlled on Lopressor low dose twice a day-she had been on diltiazem but that was discontinued-history of atrial fibrillation she also continues on amiodarone. She also has a history of abdominal aortic aneurysm will need to update her ultrasound   Family medical social history has been reviewed. per history and physical on 07/13/201 1  Medications been reviewedper MAR.   Review of systems.  In general she's denying any fever or chills denied.  Skin-denies any acute issues.  Oropharynx-does not complain of sore throat denies any increased difficulty swallowingr.  Respiratory significant  history of COPD-is complaining of increased chest congestion weakness cough-has shortness of breath.  Cardiac-denies chest pain or palpitations that she does not have any lower or edema.  GI-  Does not complaining of nausea vomiting diarrhea or constipation or abdominal pain this evening she at times does complain of some abdominal discomfort  Muscle skeletal-not complain of joint pain has complained of some hip pain in the past however.  Neurologic denies any headache dizziness or syncopal episodes this evening.  Psych significant history of anxiety-this is stable for a while .  Physical exam.  Temperature 97.3 pulse 84 respirations 20 blood pressure 128/82 O2 saturation 96% on oxygen her weight is 98.2 this is stable Gen.  a frail elderly female in no acute distress-as somewhat uncomfortable appears quite weak tired.  Skin is warm and dry.o  Oropharynx is clear mucous membranes moisttdo not note any increased erythema.  Eyes pupils equal round reactive to light sclerae and conjunctivae are clear  Chest -decreased breath sounds-diffuse wheezing-minimally labored breathing.  Heart-distant heart sound regular rate and rhythm.  Abdomen soft--minimally tender somewhat protuberant positive bowel sounds.  Muscle skeletal-was extremities x4 usually ambulates in a wheelchair no deformities noted.  Neurologic grossly intact speech is clear no lateralizing findings noted.  Psych continues to be pleasant appropriate --it appears to be tired weak.  .  Labs. 09/03/2012.  TSH-0.993.  WBC 6.8 hemoglobin 9.9 platelets 268.  Sodium 140 potassium 4.1 BUN 10 creatinine 0.71.  Liver function tests within normal limits except bilirubin 0.2-albumin of 3.0.   Marland Kitchen  05/05/2012.  WBC 10.4 hemoglobin 10.2 platelets 2:15.  Sodium 142 potassium 4.2 BUN 22 creatinine 0.71.  Liver function tests within normal limits except albumin of 3.0.  04/07/2012.  ZOX-0.960.   Assessment and plan.   #1-COPD/pneumonia--she has been started on Avelox will add a prednisone taper and extend the Avelox for a 10 day course-also monitor vital signs every shift pulse ox for 72 hours-and make Robitussin routine every 6 hours for 72 hours and then when necessary-also will update a CBC as well as metabolic panel.  #2-weakness-I suspect this is secondary to respiratory issues ---- blood work ordered as noted above.    .  #3 hypothyroidism-on Synthroid recent TSH within normal limits.  #4 atrial fibrillation-this appears rate controlled on amiodarone and low dose Lopressor-on aspirin for anticoagulation.--Will hold amiodarone while patient is on Avelox secondary to drug interaction-concerns for QT prolongation   #5 history of abdominal aortic aneurysm-this has been stable but is due for an update since it had been a year-will update abdominal ultrasound.  #6-anemia-this has been stable per recent CBCs.will update  #7-anxiety-stable on Xanax 4 times a day.  #8- does have a history of edema but this really does not appear to be an issue here for some time she is on Lasix--low dose--we'll update metabolic panel.  #9-constipation has been an issue in the past apparently this is stable now she is on numerous agents including Senokot Colace and MiraLax.  #10-history pelvic fracture stash she appears to have recovered fairly well from this she ambulates in a wheelchair and does well in this setting-she is on calcium supplementation.  #11 dementia-this appears to be moderate she is on Namenda   CPT-[id number]-of note greater than 40 minutes spent assessing patient-addressing her concerns-and formulating and coordinating plan of care for numerous diagnoses.  Of note greater than 50% of time spent coordinating and formulating plan of care CPT [phone]

## 2012-11-14 ENCOUNTER — Non-Acute Institutional Stay (SKILLED_NURSING_FACILITY): Payer: Medicare Other | Admitting: Internal Medicine

## 2012-11-14 DIAGNOSIS — L0390 Cellulitis, unspecified: Secondary | ICD-10-CM

## 2012-11-14 DIAGNOSIS — L0291 Cutaneous abscess, unspecified: Secondary | ICD-10-CM

## 2012-12-02 ENCOUNTER — Other Ambulatory Visit: Payer: Self-pay | Admitting: *Deleted

## 2012-12-02 MED ORDER — TRAMADOL HCL 50 MG PO TABS: ORAL_TABLET | ORAL | Status: AC

## 2012-12-16 ENCOUNTER — Other Ambulatory Visit: Payer: Self-pay | Admitting: *Deleted

## 2012-12-16 MED ORDER — HYDROCODONE-ACETAMINOPHEN 5-325 MG PO TABS: ORAL_TABLET | ORAL | Status: DC

## 2012-12-19 ENCOUNTER — Non-Acute Institutional Stay (SKILLED_NURSING_FACILITY): Payer: Medicare Other | Admitting: Internal Medicine

## 2012-12-19 DIAGNOSIS — I714 Abdominal aortic aneurysm, without rupture, unspecified: Secondary | ICD-10-CM

## 2012-12-19 DIAGNOSIS — I4891 Unspecified atrial fibrillation: Secondary | ICD-10-CM

## 2012-12-19 DIAGNOSIS — R109 Unspecified abdominal pain: Secondary | ICD-10-CM

## 2012-12-19 DIAGNOSIS — E039 Hypothyroidism, unspecified: Secondary | ICD-10-CM

## 2012-12-19 DIAGNOSIS — F411 Generalized anxiety disorder: Secondary | ICD-10-CM

## 2012-12-19 DIAGNOSIS — R609 Edema, unspecified: Secondary | ICD-10-CM

## 2012-12-19 DIAGNOSIS — J441 Chronic obstructive pulmonary disease with (acute) exacerbation: Secondary | ICD-10-CM

## 2012-12-19 DIAGNOSIS — D649 Anemia, unspecified: Secondary | ICD-10-CM

## 2012-12-19 NOTE — Progress Notes (Signed)
Patient ID: Melanie [name], female   DOB: Mar 31, 1926, 79 y.o.   MRN: [id number]      Facility [facility].   Level of care skilled    Chief complaint-medical management of COPD CHF hypothyroidism anxiety-acute visit secondary to persistent abdominal discomfort .   History of present illness.   Patient is a pleasant elderly resident to be quite stable most of the time   despite her fragile status.    He does have a history of COPD oxygen dependent and continues on Spiriva as well as routine nebulizers and Pulmicort--occasionally she will require antibiotic and prednisone for exasperations.  Although this is  stable recently.  Most acute issue today is some persistent mid abdominal epigastric discomfort.--This has been somewhat of a persistent on and off complaint here for over a year.  A bit over a year ago studies were ordered including an ultrasound which showed possible gallstones but no Murphy's sign to suggest acute cholecystitis-a followup radionucleotide hepatobiliary imaging showed normal hepatocellular function with a patent common bile duct but cannot really differentiate whether there was cholecystitis secondary to patient refusing morphine.  She describes the pain as epigastric kind of going up and down-not really affected by food-but she says it has affected her appetite.  She is on Prilosec.  Laboratory work done back in May did not really show anything liver function tests essentially within normal limits as well as her amylase and lipase.  She does have a history of atrial fibrillation this appears to be rate controlled although it appears she recently has not received her Lopressor and amiodarone-I suspect this is due to the recent change over to computer system and staff is correcting this as we speak.  Nonetheless her rate appears to be controlled this appears to be stable.  Her vital signs continued to be stable as well his blood pressure.  Other than the abdominal  discomfort she says has been fairly persistent now for over 2 weeks she has no complaints       Her other issues appear to be stable   s she is on routine Xanax for anxiety and this has been stable for some time as well.    she was diagnosed with bilateral pelvic fractures  Diagnosed  after a fall-she has recovered nicely from this.    e. She also has a history of abdominal aortic aneurysm recently been updated ultrasound shows relatively stable with a small 3 mm increase from a year ago currently 4.6 cm in diameter    Family medical social history has been reviewed. per history and physical on 07/13/201 1   Medications been reviewedper MAR--as noted above she has not received her Lopressor amiodarone or Namenda recently.    Review of systems.   In general she's denying any fever or chills d   Skin-denies any acute issues.   Oropharynx-does not complain of sore throat denies any increased difficulty swallowingr.   Respiratory significant history of COPD-this appears relatively baseline.   Cardiac-denies chest pain or palpitations that she does not have any lower or edema.   GI-    Continues to complain of some abdominal discomfort as noted above-however she says she is having regular bowel movements no diarrhea or constipation noted does occasionally have nausea does not have vomiting   Muscle skeletal-not complain of joint pain has complained of some hip pain in the past however.   Neurologic denies any headache dizziness or syncopal episodes this evening.   Psych significant history  of anxiety-this is stable for a while .   Physical exam.   Temperature is 97.9 pulse 68 respirations 18 blood pressure 129/85 O2 saturation 98% most recent weight is 94.6 this is down about 3 pounds since earlier this summer  Gen.  a frail elderly female in no acute distress-sitting in her wheelchair   Oropharynx is clear mucous membranes moist.   Eyes pupils equal round reactive to light sclerae  and conjunctivae are clear   Chest -decreased breath sounds-some expiratory wheezing upper lung fieldsno labored breathing.   Heart-distant heart sound regular rate and rhythm.   Abdomen soft--protuberant soft mildly tender diffusely to palpation this does not appear to be in acute tenderness-bowel sounds are very active.  .   Muscle skeletal-was extremities x4 usually ambulates in a wheelchair no deformities noted.   Neurologic grossly intact speech is clear no lateralizing findings noted.   Psych continues to be pleasant appropriate -   .   Labs. 10/24/2012.  WBC 9.8 hemoglobin 10.1 platelets 231.  Sodium 137 potassium 4.1 BUN 11 creatinine 0.7.    09/03/2012.   TSH-0.993.   WBC 6.8 hemoglobin 9.9 platelets 268.   Sodium 140 potassium 4.1 BUN 10 creatinine 0.71.   Liver function tests within normal limits except bilirubin 0.2-albumin of 3.0.     .        05/05/2012.   WBC 10.4 hemoglobin 10.2 platelets 2:15.   Sodium 142 potassium 4.2 BUN 22 creatinine 0.71.   Liver function tests within normal limits except albumin of 3.0.   04/07/2012.   ZOX-0.960.    Assessment and plan.    #1-abdominal discomfort-a continues to be a challenging situation-I did discuss this extensively with the patient and her daughter in the room today.  At this time we'll continue to monitor Will switch her toProtonix from Prilosec-also obtain lab work in the morning including a CBC with differential-CMP-TSH-amylase and lipase.  Also will order an abdominal x-ray.  She says this pain has been fairly persistent for the last couple weeks has not gotten any worse but not any better either.  Will need followup next week in Dr. Salomon Mast is in facility continue to monitor with vital signs every shift pulse ox notify any increased pain discomfort nausea or vomiting.  Also consider a GI consult.  #2-COPD this continues to be stable relatively on her nebulizers as well as Spiriva very  vulnerable individual in this regards        .   #3 hypothyroidism-on Synthroid recent TSH within normal limits.--We'll update   #4 atrial fibrillation At this point appears stable-apparently has not received her amiodarone and Lopressor the past few days staff is restarting this  as well as her aspirin-we'll continue to monitor vital signs the next few days     #5 history of abdominal aortic aneurysm-this has been relatively stable as noted above   #6-anemia-this has been stable per recent CBCs.will update   #7-anxiety-stable on Xanax 4 times a day.   #8- does have a history of edema but this really does not appear to be an issue here for some time she is on Lasix--low dose--we'll update metabolic panel.   #9-constipation has been an issue in the past apparently this is stable now she is on numerous agents including Senokot 9(which is being re started)Colace and MiraLax--.   #10-history pelvic fracture  she appears to have recovered fairly well from this she ambulates in a wheelchair and does well in this setting-she is  on calcium supplementation.   #11 dementia-this appears to be moderate --staff is restarting her Namenda as noted above apparently was not given for a few days with the computer switchover    [id number]--of note greater than 45 minutes spent assessing patient-discussing her concerns at bedside with her RP her daughter-and coordinating and formulating a plan of care with patient her daughter and also with nursing staff in regards to the medication issues as noted above--of note greater than 50% of time spent formulating and coordinating plan of care

## 2013-01-01 ENCOUNTER — Other Ambulatory Visit: Payer: Self-pay | Admitting: *Deleted

## 2013-01-01 MED ORDER — ALPRAZOLAM 1 MG PO TABS: ORAL_TABLET | ORAL | Status: DC

## 2013-01-02 DIAGNOSIS — L0390 Cellulitis, unspecified: Secondary | ICD-10-CM | POA: Insufficient documentation

## 2013-01-02 NOTE — Progress Notes (Signed)
Patient ID: Melanie [name], female   DOB: [birth year], 79 y.o.   MRN: [id number]  This is an acute visit.  Level of care skilled.  Facility is [facility]  Date is 11/14/2012.  Chief complaint-acute visit secondary to right lower arm lesion.  History of present illness.  Patient is a pleasant elderly resident with numerous diagnosis including COPD oxygen dependent as well as anxiety.  She states that she has an area on her right lower arm she is concerned about apparently it is somewhat tender to touch and erythematous.  She denies any, recently to the arm.  She denies any fever chills.  Family medical social history reviewed.  Medications have been reviewed per MAR.  Review of systems.  In general denies any fever chills.  Respiratory has significant COPD but denies any increased shortness of breath from baseline.  Cardiac does not complain of chest pain.  Skin-does complain of some tenderness to this area on her right arm denies any trauma to the arm.  Physical exam.  She is afebrile pulse of 63.  In general this is a frail elderly female in no distress sitting comfortably in her wheelchair.  Her skin is warm and dry I do note on her right lower arm there is about a centimeter and a half diameter raised firm slightly erythematous cystlike lesion-it is tender to palpation-there is no drainage or surrounding erythema or firmness here.  Chest reduced breath sounds with some expiratory wheezing this is her baseline no labored breathing.  Heart is distant heart sounds regular rate and rhythm.  Labs.  10/24/2012.  WBCs 9.8 hemoglobin 10.1 platelets 231.  Sodium 137 potassium 4.1 BUN 11 creatinine 0.70.  Assessment and plan.  #1-cyst-cellulitis-Will treat with doxycycline 100 mg twice a day for 7 days as well as a probiotic twice a day for 7 days monitor for resolution here also will treat with antibiotic ointment.  Also culture any drainage.  If this does not resolve  consider a dermatology consult.  [id number]

## 2013-01-27 ENCOUNTER — Non-Acute Institutional Stay (SKILLED_NURSING_FACILITY): Payer: Medicare Other | Admitting: Internal Medicine

## 2013-01-27 ENCOUNTER — Other Ambulatory Visit: Payer: Self-pay | Admitting: *Deleted

## 2013-01-27 DIAGNOSIS — E039 Hypothyroidism, unspecified: Secondary | ICD-10-CM

## 2013-01-27 DIAGNOSIS — D649 Anemia, unspecified: Secondary | ICD-10-CM

## 2013-01-27 DIAGNOSIS — L0291 Cutaneous abscess, unspecified: Secondary | ICD-10-CM

## 2013-01-27 DIAGNOSIS — R609 Edema, unspecified: Secondary | ICD-10-CM

## 2013-01-27 DIAGNOSIS — I4891 Unspecified atrial fibrillation: Secondary | ICD-10-CM

## 2013-01-27 DIAGNOSIS — L0390 Cellulitis, unspecified: Secondary | ICD-10-CM

## 2013-01-27 DIAGNOSIS — J441 Chronic obstructive pulmonary disease with (acute) exacerbation: Secondary | ICD-10-CM

## 2013-01-27 DIAGNOSIS — I714 Abdominal aortic aneurysm, without rupture, unspecified: Secondary | ICD-10-CM

## 2013-01-27 DIAGNOSIS — F411 Generalized anxiety disorder: Secondary | ICD-10-CM

## 2013-01-27 MED ORDER — ALPRAZOLAM 1 MG PO TABS: ORAL_TABLET | ORAL | Status: DC

## 2013-01-27 MED ORDER — ALPRAZOLAM 0.5 MG PO TABS: ORAL_TABLET | ORAL | Status: AC

## 2013-01-27 NOTE — Progress Notes (Signed)
Patient ID: Melanie [name], female   DOB: [birth year], 79 y.o.   MRN: [id number] [facility].  Level of care skilled This is a routine-acute visit   Chief complaint-medical management of COPD CHF hypothyroidism anxiety-acute visit secondary to l right arm wound  .  History of present illness.  Patient is a pleasant elderly resident to be quite stable most of the time despite her fragile status.  does have a history of COPD oxygen dependent and continues on Spiriva as well as routine nebulizers and Pulmicort--occasionally she will require antibiotic and prednisone for exasperations.  Although this is stable recently.  Most acute issue today are some wound issues-apparently she sustained a left heel wound earlier this month when she had a fall at home- seen by Dr. Leanord Hawking yesterday she is on doxycycline.  She was also noted to have a small wound on her right arm recently and this was cultured and has grown out MRSA-she is already on doxycycline  .  A bit over a year ago studies were ordered including an ultrasound which showed possible gallstones but no Murphy's sign to suggest acute cholecystitis-a followup radionucleotide hepatobiliary imaging showed normal hepatocellular function with a patent common bile duct but cannot really differentiate whether there was cholecystitis secondary to patient refusing morphine.  This apparently has not been an issue recently she is on Prilosec  .     She does have a history of atrial fibrillation this appears to be rate controlled--she is on Lopressor and  amiodarone .  Her vital signs continued to be stable as well his blood pressure.     Her other issues appear to be stable  s she is on routine Xanax for anxiety and this has been stable for some time as well.  she was diagnosed with bilateral pelvic fractures Diagnosed after a fall-she has recovered nicely from this.  e.  She also has a history of abdominal aortic aneurysm recently been updated  ultrasound shows relatively stable with a small 3 mm increase from a year ago currently 4.6 cm in diameter   Family medical social history has been reviewed. per history and physical on 07/13/201  1  Medications been reviewedper [facility]--   Review of systems.  In general she's denying any fever or chills   Skin-issues as noted in history of present illness.  Oropharynx-does not complain of sore throat denies any increased difficulty swallowingr.  Respiratory significant history of COPD-this appears relatively baseline.  Cardiac-denies chest pain or palpitations that she does not have any lower or edema.  GI-  Continues to complain of some abdominal discomfort at times--e-however she says she is having regular bowel movements no diarrhea or constipation noted does occasionally have nausea does not have vomiting  Muscle skeletal-not complain of joint pain has complained of some hip pain in the past however.  Neurologic denies any headache dizziness or syncopal episodes .  Psych significant history of anxiety-thishas beens stable for a while  .  Physical exam Temperature is 98.9 pulse 72 respirations 18 blood pressure 100/78-123/87-Weight--95.4 stable.     Gen. a frail elderly female in no acute distress-sitting in her wheelchair Skin is warm and dry-on her right lower arm there is a small open area with minimal drainage there is no surrounding edema erythema or firmness.  She also has Steri-Strips on her left lower leg .  Left heel--this is stable per nursing evaluation by Dr. Leanord Hawking yesterday apparently there is no drainage or bleeding   Oropharynx  is clear mucous membranes moist.  Eyes pupils equal round reactive to light sclerae and conjunctivae are clear  Chest -decreased breath sounds-some expiratory wheezing upper lung fields--no labored breathing.  Heart-distant heart sound regular rate and rhythm.  Abdomen soft--protuberant soft mildly tender diffusely to palpation this does not  appear to be in acute tenderness-bowel sounds are active.  .  Muscle skeletal-was extremities x4 usually ambulates in a wheelchair no deformities noted.  Neurologic grossly intact speech is clear no lateralizing findings noted.  Psych continues to be pleasant appropriate -  .  Labs 12/22/2012.  Sodium 137 potassium 4.3 BUN 13 creatinine 0.78.   liver function tests within normal limits except bilirubin of 0.1 albumin of 2.9.  WBC 6.6 hemoglobin 9.6 platelets 259.  9-13 2014.  ZOX-0.960 .  10/24/2012.  WBC 9.8 hemoglobin 10.1 platelets 231.  Sodium 137 potassium 4.1 BUN 11 creatinine 0.7.  09/03/2012.  TSH-0.993.  WBC 6.8 hemoglobin 9.9 platelets 268.  Sodium 140 potassium 4.1 BUN 10 creatinine 0.71.  Liver function tests within normal limits except bilirubin 0.2-albumin of 3.0.  .  05/05/2012.  WBC 10.4 hemoglobin 10.2 platelets 2:15.  Sodium 142 potassium 4.2 BUN 22 creatinine 0.71.  Liver function tests within normal limits except albumin of 3.0.  04/07/2012.  AVW-0.981 .  Assessment and plan.  #1 skinissues-cellulitis-she continues on doxycycline this should run until the 24th we will extend this for 3   additional days .  #2-COPD this continues to be stable relatively on her nebulizers as well as Spiriva very vulnerable individual in this regards  .  #3 hypothyroidism-on Synthroid recent TSH within normal limits.-  #4 atrial fibrillation  At this point appears stable- She is on Lopressor as well as amiodarone-  #5 history of abdominal aortic aneurysm-this has been relatively stable as noted above  #6-anemia-this has been stable per recent CBCs.will update  #7-anxiety-stable on Xanax 4 times a day.  #8- does have a history of edema but this really does not appear to be an issue here for some time she is on Lasix--low dose--we'll update metabolic panel.--He is on potassium supplementation  #9-constipation has been an issue in the past apparently this is stable now  she is on numerous agents including Senokot)Colace and MiraLax--.  #10-history pelvic fracture she appears to have recovered fairly well from this she ambulates in a wheelchair and does well in this setting-she is on calcium supplementation.  #11 dementia-this appears to be moderate --she is on Namenda   [phone]

## 2013-02-06 ENCOUNTER — Other Ambulatory Visit: Payer: Self-pay

## 2013-02-06 MED ORDER — HYDROCODONE-ACETAMINOPHEN 5-325 MG PO TABS: ORAL_TABLET | ORAL | Status: AC

## 2013-02-16 ENCOUNTER — Other Ambulatory Visit: Payer: Self-pay | Admitting: *Deleted

## 2013-02-16 MED ORDER — ALPRAZOLAM 1 MG PO TABS: ORAL_TABLET | ORAL | Status: AC

## 2013-03-26 ENCOUNTER — Non-Acute Institutional Stay (SKILLED_NURSING_FACILITY): Payer: Medicare Other | Admitting: Internal Medicine

## 2013-03-26 ENCOUNTER — Ambulatory Visit (HOSPITAL_COMMUNITY)
Admit: 2013-03-26 | Discharge: 2013-03-26 | Disposition: A | Discharge status: Home / Self Care | Payer: Medicare Other | Source: Skilled Nursing Facility | Attending: Internal Medicine | Admitting: Internal Medicine

## 2013-03-26 DIAGNOSIS — R0609 Other forms of dyspnea: Secondary | ICD-10-CM | POA: Insufficient documentation

## 2013-03-26 DIAGNOSIS — D649 Anemia, unspecified: Secondary | ICD-10-CM

## 2013-03-26 DIAGNOSIS — R0989 Other specified symptoms and signs involving the circulatory and respiratory systems: Secondary | ICD-10-CM | POA: Insufficient documentation

## 2013-03-26 DIAGNOSIS — J4489 Other specified chronic obstructive pulmonary disease: Secondary | ICD-10-CM | POA: Insufficient documentation

## 2013-03-26 DIAGNOSIS — J449 Chronic obstructive pulmonary disease, unspecified: Secondary | ICD-10-CM | POA: Insufficient documentation

## 2013-03-26 DIAGNOSIS — I4891 Unspecified atrial fibrillation: Secondary | ICD-10-CM

## 2013-03-26 DIAGNOSIS — R531 Weakness: Secondary | ICD-10-CM

## 2013-03-26 DIAGNOSIS — F411 Generalized anxiety disorder: Secondary | ICD-10-CM

## 2013-03-26 DIAGNOSIS — R059 Cough, unspecified: Secondary | ICD-10-CM | POA: Insufficient documentation

## 2013-03-26 DIAGNOSIS — I509 Heart failure, unspecified: Secondary | ICD-10-CM | POA: Insufficient documentation

## 2013-03-26 DIAGNOSIS — R05 Cough: Secondary | ICD-10-CM | POA: Insufficient documentation

## 2013-03-26 DIAGNOSIS — J441 Chronic obstructive pulmonary disease with (acute) exacerbation: Secondary | ICD-10-CM

## 2013-03-26 DIAGNOSIS — E039 Hypothyroidism, unspecified: Secondary | ICD-10-CM

## 2013-03-26 DIAGNOSIS — I714 Abdominal aortic aneurysm, without rupture: Secondary | ICD-10-CM

## 2013-03-26 DIAGNOSIS — R5381 Other malaise: Secondary | ICD-10-CM

## 2013-03-26 NOTE — Progress Notes (Signed)
Patient ID: Melanie [name], female   DOB: 08/05/1925, 79 y.o.   MRN: [id number] [facility].  Level of care skilled  This is a routine-acute visit  Chief complaint-medical management of COPD CHF hypothyroidism anxiety-acute visit secondary to?bronchitis  .  History of present illness.  Patient is a pleasant elderly resident to be quite stable most of the time despite her fragile status.  does have a history of COPD oxygen dependent and continues on Spiriva as well as routine nebulizers and Pulmicort--occasionally she will require antibiotic and prednisone for exasperations.  Apparently last few days she has not been herself says she feels weaker apparently has had some increased congestion-an x-ray was ordered which showed COPD and possible acute bronchitis.  Her vital signs continued to be stable her blood pressure does run low at times but this is not a new finding-her main complaint is she just feels washed out.  In regards to other issues     A bit over a year ago studies were ordered including an ultrasound which showed possible gallstones but no Murphy's sign to suggest acute cholecystitis-a followup radionucleotide hepatobiliary imaging showed normal hepatocellular function with a patent common bile duct but cannot really differentiate whether there was cholecystitis secondary to patient refusing morphine.  This apparently has not been an issue recently she is on Prilosec  .  She does have a history of atrial fibrillation this appears to be rate controlled--she is on Lopressor and amiodarone  .  Her vital signs continued to be stable -again at times her blood pressure does run a bit low but she does not appear to be overtly symptomatic.  Her   s she is on routine Xanax for anxiety and this has been stable for some time as well.  she was diagnosed with bilateral pelvic fractures Diagnosed after a fall-she has recovered nicely from this.  e.  She also has a history of abdominal aortic  aneurysm recently been updated ultrasound shows relatively stable with a small 3 mm increase from a year ago currently 4.6 cm in diameter   Family medical social history has been reviewed. per history and physical on 07/13/201  1  Medications been reviewed per [facility]--   Review of systems.  In general she's denying any fever or chills --that she just feels Skin-does have some skin tears on her lower extremities.  Oropharynx-at times will complain of sore throat- denies any increased difficulty swallowingr.  Respiratory significant history of COPD-does not really complain of any acute changes in her breathing-although I do hear some wheezing on exam  Cardiac-denies chest pain or palpitations that she does not have any lower or edema.  GI-  has regular bowel movements no diarrhea or constipation noted does occasionally have nausea does not have vomiting  Muscle skeletal-not complain of joint pain has complained of some hip pain in the past however.  Neurologic denies any headache dizziness or syncopal episodes .  Psych significant history of anxiety-thishas beens stable for a while  .  Physical exam  She is afebrile pulse 72 respirations 19 blood pressure 92/48.  O2 saturation 100% on 2 L oxygen .  Gen. a frail elderly female in no acute distress-lying in bed Skin is warm and dry- He does have some skin tears on her lower extremities Steri-Strips in place over a couple of these-I do not see any edema erythema or drainage or sign of infection.  She also has Steri-Strips on her left lower leg .  Oropharynx is clear mucous membranes moist.  Eyes pupils equal round reactive to light sclerae and conjunctivae are clear  Chest -decreased breath sounds-some expiratory wheezing--rhonchi this is fairly diffuse --no labored breathing.  Heart-distant heart sound regular rate and rhythm.  Abdomen soft--protuberant soft minimally tender to palpation this does not appear to be in acute  tenderness-bowel sounds are active.  .  Muscle skeletal-was extremities x4 usually ambulates in a wheelchair no deformities noted.  Neurologic grossly intact speech is clear no lateralizing findings noted.  Psych continues to be pleasant appropriate -  .  Labs  01/28/2013  WBC 6.4 hemoglobin 10.4 platelets 224.  Sodium 142 potassium 4.1 BUN 31 creatinine 0.93.   12/22/2012.  Sodium 137 potassium 4.3 BUN 13 creatinine 0.78.  liver function tests within normal limits except bilirubin of 0.1 albumin of 2.9.  WBC 6.6 hemoglobin 9.6 platelets 259.  9-13 2014.  ZOX-0.960  .  10/24/2012.  WBC 9.8 hemoglobin 10.1 platelets 231.  Sodium 137 potassium 4.1 BUN 11 creatinine 0.7.  09/03/2012.  TSH-0.993.  WBC 6.8 hemoglobin 9.9 platelets 268.  Sodium 140 potassium 4.1 BUN 10 creatinine 0.71.  Liver function tests within normal limits except bilirubin 0.2-albumin of 3.0.  .  05/05/2012.  WBC 10.4 hemoglobin 10.2 platelets 2:15.  Sodium 142 potassium 4.2 BUN 22 creatinine 0.71.  Liver function tests within normal limits except albumin of 3.0.  04/07/2012.  AVW-0.981  .  Assessment and plan.    .  #1-COPD --she continues on an aggressive regimen of nebulizers 3 times a day and when necessary rash also on Spiriva as well as Pulmicort-she does have when necessary guaifenesin will make this routine every 6 hours for 72 hours and then back to when necessary.  Also will do a prednisone taper starting at 40 mg a day for 2 days 30 for 2 days 20 for 2 days 10 for 2 days and then DC.  Secondary to her fragile status she has done well with antibiotic in the past we'll start Levaquin 500 mg for the firstday and 250 mg for 7 additional days-also will add a probiotic.  Hold the amiodarone while she is on the quinolone  #2-weakness-this could very well be respiratory but will order a UA C&S as well as blood work including a TSH CBC with differential and basic metabolic panel.    .  #3  hypothyroidism-on Synthroid we'll update TSH  #4 atrial fibrillation  At this point appears stable-  She is on Lopressor as well as amiodarone- year-old will be held while she is on the quinolone #5 history of abdominal aortic aneurysm-this has been relatively stable as noted above  #6-anemia-this has been stable   #7-anxiety-stable on Xanax 4 times a day.  #8- does have a history of edema but this really does not appear to be an issue here for some time she is on Lasix--low dose--we'll update metabolic panel.-- is on potassium supplementation  #9-constipation has been an issue in the past apparently this is stable now she is on numerous agents including Senokot)Colace and MiraLax--.  #10-history pelvic fracture she appears to have recovered fairly well from this she ambulates in a wheelchair and does well in this setting-she is on calcium supplementation.  #11 dementia-this appears to be moderate --she is on Namenda  Addendum we have received updated lab work that was ordered.  White count is 7.6 hemoglobin 10.9 platelets 182.  Sodium 138 potassium 4.4 BUN 16 creatinine 1.02.  All these findings are  relatively baseline which is encouraging again will continue with the aforementioned plan of care     [phone]

## 2013-04-12 ENCOUNTER — Ambulatory Visit (HOSPITAL_COMMUNITY)
Admit: 2013-04-12 | Discharge: 2013-04-12 | Disposition: A | Discharge status: Home / Self Care | Payer: Medicare Other | Source: Skilled Nursing Facility | Attending: Internal Medicine | Admitting: Internal Medicine

## 2013-04-12 DIAGNOSIS — R059 Cough, unspecified: Secondary | ICD-10-CM | POA: Insufficient documentation

## 2013-04-12 DIAGNOSIS — R0602 Shortness of breath: Secondary | ICD-10-CM | POA: Insufficient documentation

## 2013-04-12 DIAGNOSIS — R05 Cough: Secondary | ICD-10-CM | POA: Insufficient documentation

## 2013-04-12 DIAGNOSIS — R509 Fever, unspecified: Secondary | ICD-10-CM | POA: Insufficient documentation

## 2013-04-12 DIAGNOSIS — R0989 Other specified symptoms and signs involving the circulatory and respiratory systems: Secondary | ICD-10-CM | POA: Insufficient documentation

## 2013-04-23 ENCOUNTER — Non-Acute Institutional Stay (SKILLED_NURSING_FACILITY): Payer: Medicare Other | Admitting: Internal Medicine

## 2013-04-23 DIAGNOSIS — R627 Adult failure to thrive: Secondary | ICD-10-CM | POA: Insufficient documentation

## 2013-04-23 NOTE — Progress Notes (Signed)
Patient ID: Melanie [name], female   DOB: 04/12/1925, 80 y.o.   MRN: [id number]   This is an acute visit.  Level of care skilled.  [facility]  Chief complaint-acute visit secondary to failure to thrive.  History of present illness.  Patient is a pleasant elderly resident who has been a long-term resident at this facility.  She has been fairly stable although very frail-sometimes she will have COPD exasperations also had a pelvic fracture which he appears to have recovered well from that also some history of abdominal pain which has been stable for some time--did show gallstones but appear to indicate no active cholecystitis-she is not really complaining of abdominal pain today.  However over the past several days-- according to her daughter longer than that --she has had some failure to thrive issues-has been weaker not getting up in her wheelchair as much-appear to be gradually declined.  Lab work so far does not appear very remarkable her TSH is slightly depressed at 0.277 she is on Synthroid 75 mcg.  Her hemoglobin is 10 white count is 10.3 metabolic panel is quite unremarkable except for a CO2 level of 36-.  Her vital signs actually are stable she is on chronic oxygen her O2 sats have been in the 90s.  When I spoke with her today she had no acute complaints just that she felt weak and tired she denied any pain including any abdominal pain or increased shortness of breath or chest pain.  She has had instances in the past where she has had somewhat of a similar presentation and then bounced back to her baseline-.  Family medical social history as been reviewed most recently progress note 03/26/2013.  Medications have been reviewed per MAR.  Review of systems.  In general denies any fever or chills just says she feels very weak.  Skin does not complaining of any itching or rash.  Head ears eyes nose mouth and throat-does not complaining of visual changes or sore  throat.  Respiratory-no complaints of shortness of breath or cough is chronic COPD.  Cardiac no chest pain or edema.  GI-is not complaining of any abdominal discomfort nausea vomiting diarrhea or constipation.  GU denies dysuria.  Muscle skeletal is not complaining of any joint pain this evening says she just feels diffusely weak.  Neurologic no complaints of headache or dizziness.  Psych she has a significant history of anxiety is on Xanax long-term and has tolerated this well.  Physical exam.  Temperature is 97.4 pulse 82 respirations 18 blood pressure 115/41 O2 saturation is in the 90s on 2 L via nasal cannula.  General this is a frail elderly female in no distress but appears to be quite weak-lying in bed.  Her skin is warm and dry.  Eyes pupils do appear reactive to light sclera and conjunctiva are clear.  Oropharynx is clear mucous membranes do not appear overtly dry.  Chest has reduced air entry diffusely but no rhonchi rales or wheezes or labored breathing.  Heart distant heart sounds no edema.  Abdomen is protuberant soft  with positive bowel sounds--she has some diffuse tenderness to palpation but says this is more in response to the invasive maneuver there he  Muscle skeletal does move all extremities x4 at her baseline I do not note any deficits fear or deformities.  Neurologic is grossly intact her speech is clear she moves all extremities again at baseline. Strength appears to be intact bilaterally tongue is midline.  Psych she is  oriented to self appear to be pleasant and appropriate although according to nursing staff and family she has had some confusion-she did recognize her family members who were in room with her this evening.  Labs.  04/22/2013.  0.3 hemoglobin 10 platelets 402.  Sodium 143 potassium 4.8 BUN 12 creatinine 0.75.  Urinalysis appears to be negative.  TSH-0.277.  Assessment and plan.  #1-failure to thrive ? End-of-life issues-I  did have a fairly extensive discussion with her daughter at bedside- patient has somewhat similar presentations in the distant past and rebounded-her daughter has expressed a desire for hospice care--will write a consult order.  I also discussed further workup including obtaining further labs including an ammonia level-- liver function tests-however her daughter expressed concern stating that her mom did not do well with the last lab draw and thus.Marland Kitchen at this point will not be real aggressive here-  I will  decrease her Synthroid dose to 50 mcg as well secondary to the depressed TSH--  [id number] of note greater than 35 minutes spent assessing patient and discussing family concerns at bedside as well as coordinating and formulating a plan of care.  Of note greater than 50% of time spent coordinating and formulating a plan of care

## 2013-04-27 ENCOUNTER — Non-Acute Institutional Stay (SKILLED_NURSING_FACILITY): Payer: Medicare Other | Admitting: Internal Medicine

## 2013-04-27 DIAGNOSIS — J441 Chronic obstructive pulmonary disease with (acute) exacerbation: Secondary | ICD-10-CM

## 2013-04-27 DIAGNOSIS — R404 Transient alteration of awareness: Secondary | ICD-10-CM

## 2013-05-10 DEATH — deceased

## 2013-05-13 NOTE — Progress Notes (Addendum)
Patient ID: Melanie [name], female   DOB: 08/29/1925, 80 y.o.   MRN: [id number]               PROGRESS NOTE  DATE:  04/27/2013    FACILITY:   [facility]    LEVEL OF CARE:   SNF   Acute Visit   CHIEF COMPLAINT:  Altered LOC, Hospice care.    HISTORY OF PRESENT ILLNESS:  [name] is a lady with advanced COPD and probably chronic bronchiectasis.  She has done fairly well in the facility and basically had been stable for most of this past year.    At some point last week, she was felt to perhaps be developing the flu although work-up was not conclusive.  She had lab work done that showed a white count of 10.3, a hemoglobin of 10.  Differential count was essentially normal.  Her basic metabolic panel was also normal.  This was on 04/22/2013.    Sometime starting on Friday, she became unresponsive.  The family opted for comfort care, although the cause of this is not really clear.      PHYSICAL EXAMINATION:   VITAL SIGNS:   O2 SATURATIONS:  97%.   PULSE:  98.   GENERAL APPEARANCE:  The patient is unresponsive.  She does not look to be uncomfortable.   CHEST/RESPIRATORY:  Shallow, but otherwise clear air entry.   CARDIOVASCULAR:  CARDIAC:   Heart sounds are normal.  She does not appear to be that dehydrated.   GASTROINTESTINAL:  ABDOMEN:   Soft.  No masses.   LIVER/SPLEEN/KIDNEYS:  No liver, no spleen.  No tenderness.   GENITOURINARY:  BLADDER:   No suprapubic or costovertebral angle tenderness.    ASSESSMENT/PLAN:  Altered LOC/delirium.  The cause of this is not at all clear.  Nevertheless, the family has opted for comfort care.  She is unresponsive.  She has already been seen by Hospice.  At this point, the cause of this acute deterioration is not clear.  I am skeptical that this is her pulmonary status as she is oxygenating at 97%.  She does not appear to have been that dehydrated.  A urine for urinalysis looked fairly benign.  I suppose it is possible that she has had a  neurological event, but I cannot prove that at the bedside.    CPT CODE: [id number]
